# Patient Record
Sex: Female | Born: 1970 | Race: White | Hispanic: No | Marital: Single | Smoking: Current every day smoker
Health system: Southern US, Community
[De-identification: ages and names within clinical notes are randomized; demographics above are authoritative.]

## PROBLEM LIST (undated history)

## (undated) DIAGNOSIS — G4700 Insomnia, unspecified: Secondary | ICD-10-CM

## (undated) DIAGNOSIS — K589 Irritable bowel syndrome without diarrhea: Secondary | ICD-10-CM

## (undated) DIAGNOSIS — N200 Calculus of kidney: Secondary | ICD-10-CM

## (undated) DIAGNOSIS — K219 Gastro-esophageal reflux disease without esophagitis: Secondary | ICD-10-CM

## (undated) DIAGNOSIS — D699 Hemorrhagic condition, unspecified: Secondary | ICD-10-CM

## (undated) DIAGNOSIS — D649 Anemia, unspecified: Secondary | ICD-10-CM

## (undated) DIAGNOSIS — IMO0002 Reserved for concepts with insufficient information to code with codable children: Secondary | ICD-10-CM

## (undated) DIAGNOSIS — E119 Type 2 diabetes mellitus without complications: Secondary | ICD-10-CM

## (undated) HISTORY — DX: Reserved for concepts with insufficient information to code with codable children: IMO0002

## (undated) HISTORY — DX: Gastro-esophageal reflux disease without esophagitis: K21.9

## (undated) HISTORY — PX: SHOULDER SURGERY: SHX246

## (undated) HISTORY — DX: Hemorrhagic condition, unspecified: D69.9

## (undated) HISTORY — DX: Anemia, unspecified: D64.9

## (undated) HISTORY — DX: Type 2 diabetes mellitus without complications: E11.9

## (undated) HISTORY — DX: Calculus of kidney: N20.0

## (undated) HISTORY — DX: Irritable bowel syndrome, unspecified: K58.9

## (undated) HISTORY — DX: Insomnia, unspecified: G47.00

---

## 1992-02-27 HISTORY — PX: CHOLECYSTECTOMY: SHX55

## 1998-02-26 HISTORY — PX: ABDOMINOPLASTY: SUR9

## 2004-03-01 ENCOUNTER — Emergency Department: Payer: Self-pay | Admitting: Emergency Medicine

## 2004-04-27 ENCOUNTER — Emergency Department: Payer: Self-pay | Admitting: Emergency Medicine

## 2004-05-10 ENCOUNTER — Ambulatory Visit: Payer: Self-pay | Admitting: Internal Medicine

## 2004-05-27 ENCOUNTER — Ambulatory Visit: Payer: Self-pay | Admitting: Internal Medicine

## 2004-06-26 ENCOUNTER — Ambulatory Visit: Payer: Self-pay | Admitting: Internal Medicine

## 2005-08-14 ENCOUNTER — Emergency Department: Payer: Self-pay | Admitting: Emergency Medicine

## 2005-12-13 ENCOUNTER — Emergency Department: Payer: Self-pay | Admitting: Unknown Physician Specialty

## 2007-02-20 ENCOUNTER — Emergency Department: Payer: Self-pay | Admitting: Internal Medicine

## 2007-02-20 ENCOUNTER — Other Ambulatory Visit: Payer: Self-pay

## 2007-06-08 ENCOUNTER — Emergency Department: Payer: Self-pay | Admitting: Emergency Medicine

## 2007-09-18 ENCOUNTER — Emergency Department: Payer: Self-pay | Admitting: Emergency Medicine

## 2009-01-24 ENCOUNTER — Emergency Department: Payer: Self-pay | Admitting: Emergency Medicine

## 2010-10-15 ENCOUNTER — Emergency Department: Payer: Self-pay | Admitting: *Deleted

## 2010-11-30 ENCOUNTER — Ambulatory Visit: Payer: Self-pay | Admitting: Internal Medicine

## 2010-12-05 ENCOUNTER — Ambulatory Visit: Payer: Self-pay | Admitting: Internal Medicine

## 2010-12-28 ENCOUNTER — Ambulatory Visit: Payer: Self-pay | Admitting: Internal Medicine

## 2011-03-01 ENCOUNTER — Emergency Department: Payer: Self-pay | Admitting: Emergency Medicine

## 2011-03-02 LAB — CBC
MCH: 28.5 pg (ref 26.0–34.0)
MCHC: 33.2 g/dL (ref 32.0–36.0)
Platelet: 399 10*3/uL (ref 150–440)
RBC: 4.42 10*6/uL (ref 3.80–5.20)

## 2011-03-02 LAB — COMPREHENSIVE METABOLIC PANEL
Albumin: 3.6 g/dL (ref 3.4–5.0)
Alkaline Phosphatase: 65 U/L (ref 50–136)
Anion Gap: 13 (ref 7–16)
BUN: 15 mg/dL (ref 7–18)
Bilirubin,Total: 0.1 mg/dL — ABNORMAL LOW (ref 0.2–1.0)
Calcium, Total: 8.6 mg/dL (ref 8.5–10.1)
Co2: 26 mmol/L (ref 21–32)
Creatinine: 0.91 mg/dL (ref 0.60–1.30)
EGFR (African American): >60
EGFR (Non-African Amer.): >60
Glucose: 249 mg/dL — ABNORMAL HIGH (ref 65–99)
Osmolality: 290 (ref 275–301)

## 2011-03-02 LAB — URINALYSIS, COMPLETE
Ketone: NEGATIVE
Nitrite: NEGATIVE
Protein: NEGATIVE
Specific Gravity: 1.025 (ref 1.003–1.030)
Squamous Epithelial: 2
WBC UR: 2 /HPF (ref 0–5)

## 2011-03-02 LAB — LIPASE, BLOOD: Lipase: 288 U/L (ref 73–393)

## 2011-06-11 ENCOUNTER — Emergency Department: Payer: Self-pay | Admitting: Emergency Medicine

## 2011-06-11 LAB — URINALYSIS, COMPLETE
Bilirubin,UR: NEGATIVE
Glucose,UR: NEGATIVE mg/dL (ref 0–75)
Ketone: NEGATIVE
Leukocyte Esterase: NEGATIVE
Ph: 5 (ref 4.5–8.0)
Protein: NEGATIVE
Specific Gravity: 1.021 (ref 1.003–1.030)

## 2011-06-11 LAB — COMPREHENSIVE METABOLIC PANEL
Albumin: 4 g/dL (ref 3.4–5.0)
Alkaline Phosphatase: 86 U/L (ref 50–136)
Anion Gap: 8 (ref 7–16)
BUN: 10 mg/dL (ref 7–18)
Bilirubin,Total: 0.3 mg/dL (ref 0.2–1.0)
Calcium, Total: 8.9 mg/dL (ref 8.5–10.1)
Co2: 27 mmol/L (ref 21–32)
Creatinine: 0.91 mg/dL (ref 0.60–1.30)
EGFR (African American): >60
EGFR (Non-African Amer.): >60
Glucose: 135 mg/dL — ABNORMAL HIGH (ref 65–99)
Sodium: 140 mmol/L (ref 136–145)
Total Protein: 8.2 g/dL (ref 6.4–8.2)

## 2011-06-11 LAB — CBC
MCH: 27.9 pg (ref 26.0–34.0)
MCHC: 32.1 g/dL (ref 32.0–36.0)
WBC: 10.5 10*3/uL (ref 3.6–11.0)

## 2011-06-14 ENCOUNTER — Emergency Department: Payer: Self-pay | Admitting: Emergency Medicine

## 2011-06-14 ENCOUNTER — Ambulatory Visit: Payer: Self-pay | Admitting: Urology

## 2011-11-29 ENCOUNTER — Ambulatory Visit: Payer: Self-pay | Admitting: Internal Medicine

## 2011-11-29 LAB — CBC CANCER CENTER
Basophil #: 0.1 x10 3/mm (ref 0.0–0.1)
Eosinophil #: 0.4 x10 3/mm (ref 0.0–0.7)
HCT: 35.6 % (ref 35.0–47.0)
Lymphocyte #: 3 x10 3/mm (ref 1.0–3.6)
MCHC: 32.5 g/dL (ref 32.0–36.0)
MCV: 80 fL (ref 80–100)
Neutrophil #: 5.3 x10 3/mm (ref 1.4–6.5)
Platelet: 427 x10 3/mm (ref 150–440)
RBC: 4.43 10*6/uL (ref 3.80–5.20)
RDW: 15.6 % — ABNORMAL HIGH (ref 11.5–14.5)

## 2011-11-29 LAB — FERRITIN: Ferritin (ARMC): 28 ng/mL (ref 8–388)

## 2011-11-29 LAB — IRON AND TIBC
Iron Bind.Cap.(Total): 373 ug/dL (ref 250–450)
Iron Saturation: 6 %
Iron: 22 ug/dL — ABNORMAL LOW (ref 50–170)
Unbound Iron-Bind.Cap.: 351 ug/dL

## 2011-12-28 ENCOUNTER — Ambulatory Visit: Payer: Self-pay | Admitting: Internal Medicine

## 2012-02-27 DIAGNOSIS — E119 Type 2 diabetes mellitus without complications: Secondary | ICD-10-CM

## 2012-02-27 HISTORY — PX: LEFT OOPHORECTOMY: SHX1961

## 2012-02-27 HISTORY — DX: Type 2 diabetes mellitus without complications: E11.9

## 2012-02-27 HISTORY — PX: ABDOMINAL HYSTERECTOMY: SHX81

## 2012-03-04 ENCOUNTER — Ambulatory Visit: Payer: Self-pay | Admitting: Internal Medicine

## 2012-03-04 LAB — CBC CANCER CENTER
Basophil #: 0.1 x10 3/mm (ref 0.0–0.1)
Basophil %: 0.8 %
Eosinophil #: 0.3 x10 3/mm (ref 0.0–0.7)
HCT: 42.2 % (ref 35.0–47.0)
Lymphocyte #: 2.4 x10 3/mm (ref 1.0–3.6)
Lymphocyte %: 23.1 %
MCHC: 34 g/dL (ref 32.0–36.0)
MCV: 89 fL (ref 80–100)
Monocyte #: 0.7 x10 3/mm (ref 0.2–0.9)
Neutrophil #: 7 x10 3/mm — ABNORMAL HIGH (ref 1.4–6.5)
Neutrophil %: 66.8 %

## 2012-03-04 LAB — FERRITIN: Ferritin (ARMC): 153 ng/mL (ref 8–388)

## 2012-03-04 LAB — IRON AND TIBC
Iron Bind.Cap.(Total): 320 ug/dL (ref 250–450)
Iron Saturation: 13 %
Unbound Iron-Bind.Cap.: 278 ug/dL

## 2012-03-29 ENCOUNTER — Ambulatory Visit: Payer: Self-pay | Admitting: Internal Medicine

## 2012-04-28 ENCOUNTER — Ambulatory Visit: Payer: Self-pay | Admitting: Internal Medicine

## 2012-05-27 ENCOUNTER — Ambulatory Visit: Payer: Self-pay | Admitting: Internal Medicine

## 2012-06-10 LAB — IRON AND TIBC: Iron Saturation: 13 %

## 2012-06-10 LAB — FERRITIN: Ferritin (ARMC): 128 ng/mL (ref 8–388)

## 2012-06-11 ENCOUNTER — Ambulatory Visit: Payer: Self-pay | Admitting: Obstetrics and Gynecology

## 2012-06-11 LAB — BASIC METABOLIC PANEL
BUN: 8 mg/dL (ref 7–18)
Calcium, Total: 8.5 mg/dL (ref 8.5–10.1)
Chloride: 101 mmol/L (ref 98–107)
Co2: 26 mmol/L (ref 21–32)
Creatinine: 0.77 mg/dL (ref 0.60–1.30)
EGFR (Non-African Amer.): >60
Osmolality: 275 (ref 275–301)
Potassium: 3.7 mmol/L (ref 3.5–5.1)
Sodium: 134 mmol/L — ABNORMAL LOW (ref 136–145)

## 2012-06-11 LAB — PREGNANCY, URINE: Pregnancy Test, Urine: NEGATIVE m[IU]/mL

## 2012-06-11 LAB — CBC
MCH: 29.8 pg (ref 26.0–34.0)
MCHC: 33.8 g/dL (ref 32.0–36.0)
Platelet: 462 10*3/uL — ABNORMAL HIGH (ref 150–440)
RDW: 13.9 % (ref 11.5–14.5)

## 2012-06-16 ENCOUNTER — Ambulatory Visit: Payer: Self-pay | Admitting: Obstetrics and Gynecology

## 2012-06-17 LAB — HEMOGLOBIN: HGB: 13.1 g/dL (ref 12.0–16.0)

## 2012-06-18 LAB — PATHOLOGY REPORT

## 2012-06-26 ENCOUNTER — Ambulatory Visit: Payer: Self-pay | Admitting: Internal Medicine

## 2012-08-21 ENCOUNTER — Ambulatory Visit: Payer: Self-pay | Admitting: Internal Medicine

## 2012-08-26 ENCOUNTER — Ambulatory Visit: Payer: Self-pay | Admitting: Internal Medicine

## 2012-09-15 ENCOUNTER — Emergency Department: Payer: Self-pay | Admitting: Emergency Medicine

## 2012-10-09 ENCOUNTER — Ambulatory Visit: Payer: Self-pay | Admitting: Internal Medicine

## 2012-10-14 LAB — CBC CANCER CENTER
Basophil #: 0.1 x10 3/mm (ref 0.0–0.1)
MCV: 87 fL (ref 80–100)
Monocyte #: 0.6 x10 3/mm (ref 0.2–0.9)
Neutrophil #: 4.7 x10 3/mm (ref 1.4–6.5)
Neutrophil %: 59.3 %
RBC: 4.62 10*6/uL (ref 3.80–5.20)
RDW: 13.4 % (ref 11.5–14.5)
WBC: 7.9 x10 3/mm (ref 3.6–11.0)

## 2012-10-14 LAB — IRON AND TIBC
Iron Bind.Cap.(Total): 319 ug/dL (ref 250–450)
Iron Saturation: 14 %

## 2012-10-27 ENCOUNTER — Ambulatory Visit: Payer: Self-pay | Admitting: Internal Medicine

## 2012-11-26 ENCOUNTER — Ambulatory Visit: Payer: Self-pay | Admitting: Internal Medicine

## 2012-12-03 ENCOUNTER — Encounter: Payer: Self-pay | Admitting: *Deleted

## 2012-12-24 ENCOUNTER — Ambulatory Visit (INDEPENDENT_AMBULATORY_CARE_PROVIDER_SITE_OTHER): Payer: BC Managed Care – PPO | Admitting: General Surgery

## 2012-12-24 ENCOUNTER — Encounter: Payer: Self-pay | Admitting: General Surgery

## 2012-12-24 VITALS — BP 128/60 | HR 72 | Resp 12 | Ht 67.5 in | Wt 157.0 lb

## 2012-12-24 DIAGNOSIS — L723 Sebaceous cyst: Secondary | ICD-10-CM

## 2012-12-24 DIAGNOSIS — L729 Follicular cyst of the skin and subcutaneous tissue, unspecified: Secondary | ICD-10-CM

## 2012-12-24 NOTE — Patient Instructions (Signed)
Patient to return for excision of right gluteal cyst.

## 2012-12-24 NOTE — Progress Notes (Signed)
Patient ID: Carrie Graham, female   DOB: 12-01-1970, 42 y.o.   MRN: [id number]  Chief Complaint  Patient presents with  . Other    right gluteous cyst    HPI Carrie Graham is a 42 y.o. female who presents for an evaluation of a right gluteal cyst. The patient states the area has been there approximately 1 year. She states it flares from time to time. No current problems with this area. She does have drainage from time to time.   HPI  Past Medical History  Diagnosis Date  . Anemia   . Insomnia   . IBS (irritable bowel syndrome)   . GERD (gastroesophageal reflux disease)   . Ulcer   . Kidney stones   . Bleeding disorder     Past Surgical History  Procedure Laterality Date  . Abdominal hysterectomy  2014  . Cholecystectomy  1994  . Cesarean section  [id number]  . Abdominoplasty  2000  . Left oophorectomy  2014    Family History  Problem Relation Age of Onset  . Cancer Mother     ovarian    Social History History  Substance Use Topics  . Smoking status: Never Smoker   . Smokeless tobacco: Not on file  . Alcohol Use: No    Allergies  Allergen Reactions  . Nsaids Other (See Comments)    bleeding  . Sulfa Antibiotics Other (See Comments)    Blisters in throat  . Toradol [Ketorolac Tromethamine] Nausea And Vomiting    Current Outpatient Prescriptions  Medication Sig Dispense Refill  . temazepam (RESTORIL) 15 MG capsule Take 15 mg by mouth at bedtime as needed for sleep.       No current facility-administered medications for this visit.    Review of Systems Review of Systems  Constitutional: Negative.   Respiratory: Negative.   Cardiovascular: Negative.     Blood pressure 128/60, pulse 72, resp. rate 12, height 5' 7.5" (1.715 m), weight 157 lb (71.215 kg).  Physical Exam Physical Exam  Constitutional: She is oriented to person, place, and time. She appears well-developed and well-nourished.  Abdominal: Soft. Normal appearance and bowel sounds are  normal. There is no tenderness.  Genitourinary:  1 cm or less skin thickening located in the right ischial area. No communication towards the rectum noted.   Lymphadenopathy:       Right: No inguinal adenopathy present.       Left: No inguinal adenopathy present.  Neurological: She is alert and oriented to person, place, and time.  Skin: Skin is warm and dry.    Data Reviewed None.   Assessment    Skin cyst right gluteal area. Recommended excision and she is agreeable.    Plan    Patient to return for excision of right gluteal cyst.        Keishawn Rajewski G 12/25/2012, 6:08 AM

## 2012-12-25 ENCOUNTER — Encounter: Payer: Self-pay | Admitting: General Surgery

## 2013-01-01 ENCOUNTER — Encounter: Payer: Self-pay | Admitting: General Surgery

## 2013-01-01 ENCOUNTER — Ambulatory Visit (INDEPENDENT_AMBULATORY_CARE_PROVIDER_SITE_OTHER): Payer: BC Managed Care – PPO | Admitting: General Surgery

## 2013-01-01 VITALS — BP 124/76 | HR 78 | Resp 12 | Ht 67.5 in | Wt 158.0 lb

## 2013-01-01 DIAGNOSIS — L723 Sebaceous cyst: Secondary | ICD-10-CM

## 2013-01-01 NOTE — Progress Notes (Signed)
Patient here today for excision of a right gluteal mass.  Procedure: Excision skin cyst right gluteal area. Anesthetic: 10 mL of 0.5% Marcaine mixed with 1% Xylocaine.  Prep: ChloraPrep  After the gluteal area was prepped and draped out an elliptical excision was then performed of assistant was a little less than a 2 cm size. Beeding was controlled with a disposable cautery. The wound was then closed with interrupted figure-of-eight stitches of 4-0 nylon. Excised tissue sent for pathology. Neosporin ointment and 4x4 placed over the wound. No immediate problems from the procedure encountered. Wound care instructions were given and patient is to return in about 10 days for suture removal.

## 2013-01-01 NOTE — Patient Instructions (Addendum)
The patient is aware to call back for any questions or concerns. Nurse 10 days for suture removal Ice pack off/on for the rest of the day for comfort

## 2013-01-02 LAB — PATHOLOGY

## 2013-01-06 ENCOUNTER — Telehealth: Payer: Self-pay | Admitting: *Deleted

## 2013-01-06 NOTE — Telephone Encounter (Signed)
Message copied by Currie Paris on Tue Jan 06, 2013  8:14 AM ------      Message from: Kieth Brightly      Created: Mon Jan 05, 2013  8:23 AM       Please let pt pt know the pathology was normal. ------

## 2013-01-06 NOTE — Telephone Encounter (Signed)
Notified patient as instructed, patient pleased. Discussed follow-up appointments, patient agrees  

## 2013-01-07 IMAGING — CR DG ABDOMEN 1V
1 series · 2 of 2 positions shown · non-contrast
Comparison: none

REASON FOR EXAM: Calculus
COMMENTS:

[Series 1: t abdomen supine · 0.14mm/px · 2 of 2 slices shown]
[im 1/2]
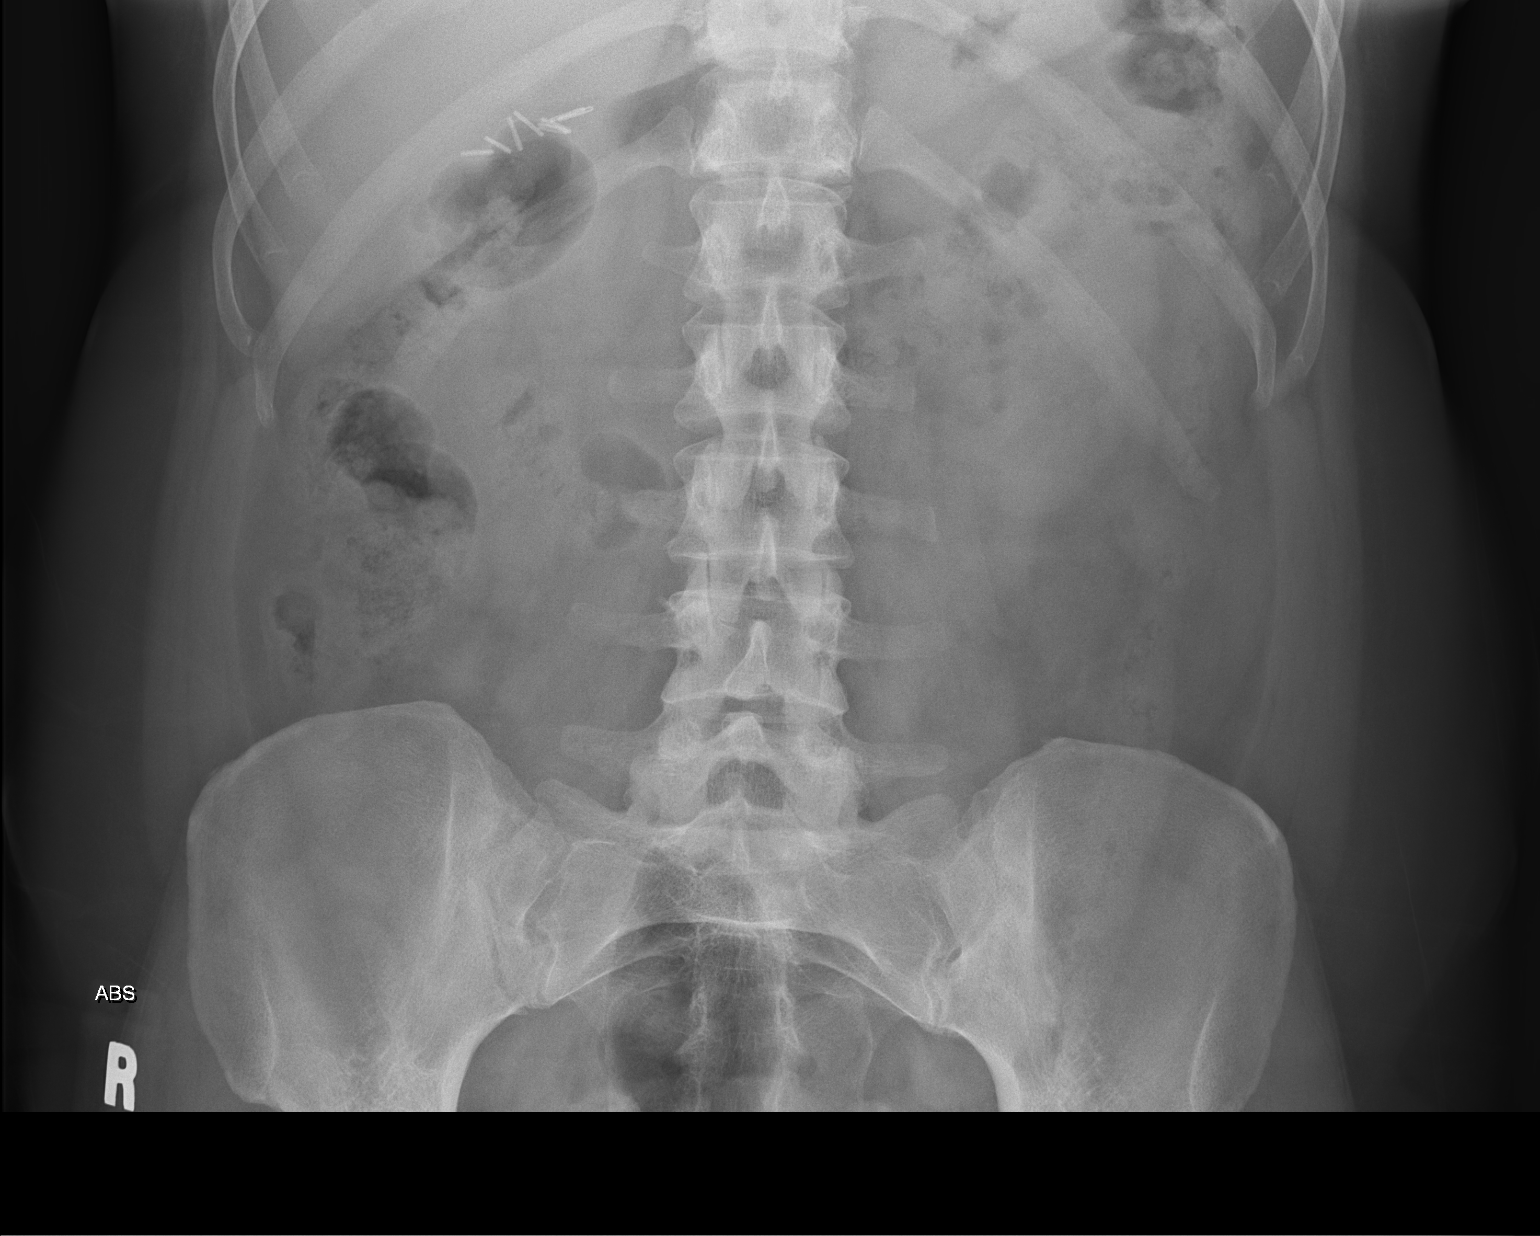
[im 2/2]
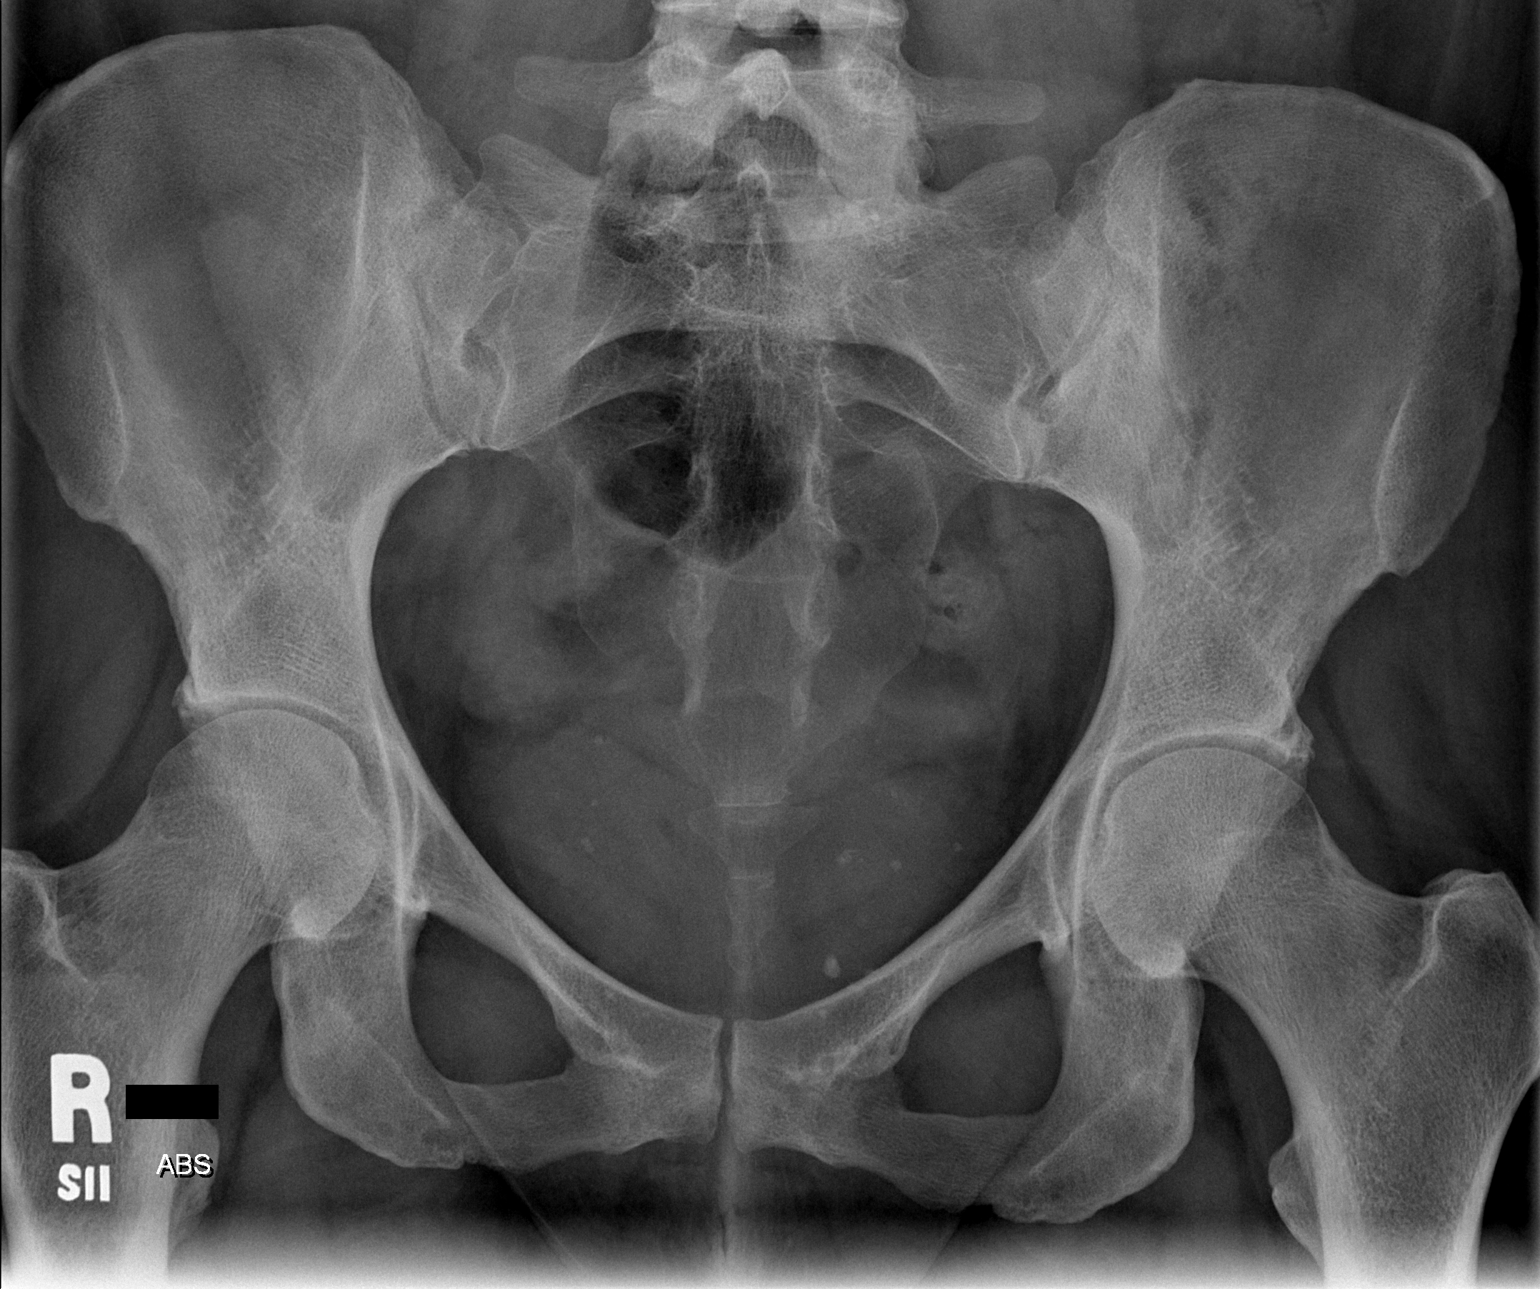

[2 of 2 positions shown; findings below may reference images not displayed]

PROCEDURE:     DXR - DXR KIDNEY URETER BLADDER  - June 14, 2011  [DATE]

RESULT:

No definite intrarenal calcifications are seen. There are multiple
calcifications in the pelvic area bilaterally compatible with phleboliths.
There is an irregular calcification located low in the medial aspect of the
left pelvis that could represent the distal left ureteral stone observed at
CT.

The bowel gas pattern is normal. Post operative metallic clips are noted in
the region of the gallbladder bed.
IMPRESSION: 1.  No definite intrarenal stones are seen.
2.  Possible distal left ureterolithiasis.

## 2013-01-13 ENCOUNTER — Ambulatory Visit: Payer: BC Managed Care – PPO | Admitting: *Deleted

## 2013-01-13 ENCOUNTER — Ambulatory Visit: Payer: Self-pay | Admitting: Internal Medicine

## 2013-01-13 DIAGNOSIS — L729 Follicular cyst of the skin and subcutaneous tissue, unspecified: Secondary | ICD-10-CM

## 2013-01-13 LAB — CBC CANCER CENTER
Basophil %: 1.1 %
Eosinophil #: 0.3 x10 3/mm (ref 0.0–0.7)
Lymphocyte #: 1.9 x10 3/mm (ref 1.0–3.6)
Lymphocyte %: 21.1 %
MCH: 31 pg (ref 26.0–34.0)
MCHC: 34.5 g/dL (ref 32.0–36.0)
MCV: 90 fL (ref 80–100)
Neutrophil #: 6.2 x10 3/mm (ref 1.4–6.5)
Neutrophil %: 68 %
Platelet: 362 x10 3/mm (ref 150–440)
RBC: 4.64 10*6/uL (ref 3.80–5.20)

## 2013-01-13 LAB — IRON AND TIBC: Iron: 73 ug/dL (ref 50–170)

## 2013-01-13 NOTE — Progress Notes (Signed)
Here today for suture removal. Area clean and well healed.  No signs of infection.

## 2013-01-13 NOTE — Patient Instructions (Signed)
The patient is aware to call back for any questions or concerns.  

## 2013-01-21 ENCOUNTER — Ambulatory Visit: Payer: BC Managed Care – PPO | Admitting: General Surgery

## 2013-01-26 ENCOUNTER — Ambulatory Visit: Payer: Self-pay | Admitting: Internal Medicine

## 2013-02-04 ENCOUNTER — Ambulatory Visit (INDEPENDENT_AMBULATORY_CARE_PROVIDER_SITE_OTHER): Payer: BC Managed Care – PPO | Admitting: General Surgery

## 2013-02-04 ENCOUNTER — Encounter: Payer: Self-pay | Admitting: General Surgery

## 2013-02-04 VITALS — BP 110/68 | HR 74 | Resp 12 | Ht 67.0 in | Wt 163.0 lb

## 2013-02-04 DIAGNOSIS — K429 Umbilical hernia without obstruction or gangrene: Secondary | ICD-10-CM | POA: Insufficient documentation

## 2013-02-04 NOTE — Patient Instructions (Addendum)
Hernia A hernia occurs when an internal organ pushes out through a weak spot in the abdominal wall. Hernias most commonly occur in the groin and around the navel. Hernias often can be pushed back into place (reduced). Most hernias tend to get worse over time. Some abdominal hernias can get stuck in the opening (irreducible or incarcerated hernia) and cannot be reduced. An irreducible abdominal hernia which is tightly squeezed into the opening is at risk for impaired blood supply (strangulated hernia). A strangulated hernia is a medical emergency. Because of the risk for an irreducible or strangulated hernia, surgery may be recommended to repair a hernia. CAUSES   Heavy lifting.  Prolonged coughing.  Straining to have a bowel movement.  A cut (incision) made during an abdominal surgery. HOME CARE INSTRUCTIONS   Bed rest is not required. You may continue your normal activities.  Avoid lifting more than 10 pounds (4.5 kg) or straining.  Cough gently. If you are a smoker it is best to stop. Even the best hernia repair can break down with the continual strain of coughing. Even if you do not have your hernia repaired, a cough will continue to aggravate the problem.  Do not wear anything tight over your hernia. Do not try to keep it in with an outside bandage or truss. These can damage abdominal contents if they are trapped within the hernia sac.  Eat a normal diet.  Avoid constipation. Straining over long periods of time will increase hernia size and encourage breakdown of repairs. If you cannot do this with diet alone, stool softeners may be used. SEEK IMMEDIATE MEDICAL CARE IF:   You have a fever.  You develop increasing abdominal pain.  You feel nauseous or vomit.  Your hernia is stuck outside the abdomen, looks discolored, feels hard, or is tender.  You have any changes in your bowel habits or in the hernia that are unusual for you.  You have increased pain or swelling around the  hernia.  You cannot push the hernia back in place by applying gentle pressure while lying down. MAKE SURE YOU:   Understand these instructions.  Will watch your condition.  Will get help right away if you are not doing well or get worse. Document Released: 02/12/2005 Document Revised: 05/07/2011 Document Reviewed: 10/02/2007 [facility] Patient Information 2014 [place], [place].  Patient's surgery has been scheduled for 03-03-13 at [facility].

## 2013-02-04 NOTE — Progress Notes (Signed)
Patient ID: Carrie Graham, female   DOB: 26-May-1970, 42 y.o.   MRN: [id number]  Chief Complaint  Patient presents with  . Hernia    Carrie Graham is a 42 y.o. female.  Here today for evaluation of possible umbilical hernia.  States it has been there about 6 months.  The abdominal pain has been since last year located left upper abdomen and central abdomen. She notices tenderness at the umbilical area for the past couple of months. Newly diagnosed diabetic. HPI  Past Medical History  Diagnosis Date  . Anemia   . Insomnia   . IBS (irritable bowel syndrome)   . GERD (gastroesophageal reflux disease)   . Ulcer   . Kidney stones   . Bleeding disorder   . Diabetes mellitus without complication 2014    Past Surgical History  Procedure Laterality Date  . Abdominal hysterectomy  2014  . Cholecystectomy  1994  . Cesarean section  [id number]  . Abdominoplasty  2000  . Left oophorectomy  2014    Family History  Problem Relation Age of Onset  . Cancer Mother     ovarian    Social History History  Substance Use Topics  . Smoking status: Never Smoker   . Smokeless tobacco: Not on file  . Alcohol Use: No    Allergies  Allergen Reactions  . Nsaids Other (See Comments)    bleeding  . Sulfa Antibiotics Other (See Comments)    Blisters in throat  . Toradol [Ketorolac Tromethamine] Nausea And Vomiting    Current Outpatient Prescriptions  Medication Sig Dispense Refill  . ALPRAZolam (XANAX) 0.5 MG tablet Take 0.5 mg by mouth as needed for anxiety.      Marland Kitchen escitalopram (LEXAPRO) 10 MG tablet Take 10 mg by mouth daily.      . Insulin Aspart (NOVOLOG [place]) Inject into the skin 3 (three) times daily.      . Insulin Detemir (LEVEMIR [place]) Inject into the skin 2 (two) times daily.      . sitaGLIPtin (JANUVIA) 100 MG tablet Take 100 mg by mouth daily.      . temazepam (RESTORIL) 15 MG capsule Take 15 mg by mouth at bedtime as needed for sleep.       No current facility-administered  medications for this visit.    Review of Systems Review of Systems  Constitutional: Negative.   Respiratory: Negative.   Cardiovascular: Negative.   Gastrointestinal: Positive for abdominal pain.    Blood pressure 110/68, pulse 74, resp. rate 12, height 5\' 7"  (1.702 m), weight 163 lb (73.936 kg).  Physical Exam Physical Exam  Constitutional: She is oriented to person, place, and time. She appears well-developed and well-nourished.  Eyes: Conjunctivae are normal. No scleral icterus.  Neck: Neck supple.  Cardiovascular: Normal rate, regular rhythm and normal heart sounds.   Pulmonary/Chest: Effort normal and breath sounds normal.  Abdominal: Soft. Normal appearance and bowel sounds are normal. There is no tenderness. A hernia is present.  2 cm defect at the umbilicus, minimally tender  Lymphadenopathy:    She has no cervical adenopathy.  Neurological: She is alert and oriented to person, place, and time.  Skin: Skin is warm and dry.    Data Reviewed none  Assessment    A 2 cm defect at the umbilicus, minimally tender.    Plan    Hernia precautions and incarceration were discussed with the patient. If they develop symptoms of an incarcerated hernia, they were encouraged to seek prompt  medical attention.  I have recommended repair of the hernia using mesh on an outpatient basis in the near future. The risk of infection was reviewed. The role of prosthetic mesh to minimize the risk of recurrence was reviewed.    Patient's surgery has been scheduled for 03-03-13 at [facility]. This patient will not require a pre-operative visit. History and physical will be updated the morning of procedure.   Carrie Graham 02/04/2013, 12:21 PM

## 2013-02-18 ENCOUNTER — Other Ambulatory Visit: Payer: Self-pay | Admitting: General Surgery

## 2013-02-18 DIAGNOSIS — K429 Umbilical hernia without obstruction or gangrene: Secondary | ICD-10-CM

## 2013-02-24 ENCOUNTER — Other Ambulatory Visit: Payer: Self-pay | Admitting: Anesthesiology

## 2013-02-24 LAB — IRON AND TIBC
Iron Bind.Cap.(Total): 308 ug/dL (ref 250–450)
Iron Saturation: 24 %

## 2013-02-24 LAB — CBC CANCER CENTER
Basophil #: 0.1 x10 3/mm (ref 0.0–0.1)
Eosinophil #: 0.4 x10 3/mm (ref 0.0–0.7)
Eosinophil %: 3.8 %
HGB: 14 g/dL (ref 12.0–16.0)
Lymphocyte #: 1.9 x10 3/mm (ref 1.0–3.6)
Lymphocyte %: 18.5 %
MCH: 30.4 pg (ref 26.0–34.0)
MCV: 92 fL (ref 80–100)
Monocyte #: 0.6 x10 3/mm (ref 0.2–0.9)
Monocyte %: 5.5 %
Platelet: 401 x10 3/mm (ref 150–440)
RDW: 12.9 % (ref 11.5–14.5)
WBC: 10.3 x10 3/mm (ref 3.6–11.0)

## 2013-02-24 LAB — BASIC METABOLIC PANEL
BUN: 10 mg/dL (ref 7–18)
Calcium, Total: 9 mg/dL (ref 8.5–10.1)
Creatinine: 0.85 mg/dL (ref 0.60–1.30)
EGFR (Non-African Amer.): >60
Glucose: 357 mg/dL — ABNORMAL HIGH (ref 65–99)
Osmolality: 287 (ref 275–301)
Sodium: 137 mmol/L (ref 136–145)

## 2013-02-24 LAB — FERRITIN: Ferritin (ARMC): 445 ng/mL — ABNORMAL HIGH (ref 8–388)

## 2013-02-25 ENCOUNTER — Other Ambulatory Visit: Payer: Self-pay | Admitting: Anesthesiology

## 2013-02-25 DIAGNOSIS — I10 Essential (primary) hypertension: Secondary | ICD-10-CM

## 2013-02-26 ENCOUNTER — Ambulatory Visit: Payer: Self-pay | Admitting: Internal Medicine

## 2013-03-03 ENCOUNTER — Ambulatory Visit: Payer: Self-pay | Admitting: General Surgery

## 2013-03-03 DIAGNOSIS — K429 Umbilical hernia without obstruction or gangrene: Secondary | ICD-10-CM

## 2013-03-03 HISTORY — PX: HERNIA REPAIR: SHX51

## 2013-03-04 ENCOUNTER — Encounter: Payer: Self-pay | Admitting: General Surgery

## 2013-03-11 ENCOUNTER — Ambulatory Visit (INDEPENDENT_AMBULATORY_CARE_PROVIDER_SITE_OTHER): Payer: BC Managed Care – PPO | Admitting: General Surgery

## 2013-03-11 ENCOUNTER — Encounter: Payer: Self-pay | Admitting: General Surgery

## 2013-03-11 VITALS — BP 102/82 | HR 78 | Resp 12 | Ht 67.0 in | Wt 160.0 lb

## 2013-03-11 DIAGNOSIS — K429 Umbilical hernia without obstruction or gangrene: Secondary | ICD-10-CM

## 2013-03-11 NOTE — Patient Instructions (Addendum)
The patient is aware to call back for any questions or concerns. May increase activity as tolerated.

## 2013-03-11 NOTE — Progress Notes (Signed)
Patient here today for post operative visit she had lap/umbilical hernia repair with Bard mesh 03-03-13, pain is improving.  Blood sugars are improving. Ports site healing, derma bond in place and repair is intact. Dimpling area should improve over time.

## 2013-03-22 ENCOUNTER — Observation Stay: Payer: Self-pay | Admitting: General Surgery

## 2013-03-22 LAB — BASIC METABOLIC PANEL
ANION GAP: 3 — AB (ref 7–16)
BUN: 11 mg/dL (ref 7–18)
CALCIUM: 9.3 mg/dL (ref 8.5–10.1)
CHLORIDE: 102 mmol/L (ref 98–107)
CO2: 29 mmol/L (ref 21–32)
Creatinine: 0.64 mg/dL (ref 0.60–1.30)
EGFR (Non-African Amer.): >60
Glucose: 148 mg/dL — ABNORMAL HIGH (ref 65–99)
Osmolality: 270 (ref 275–301)
Potassium: 3.8 mmol/L (ref 3.5–5.1)
Sodium: 134 mmol/L — ABNORMAL LOW (ref 136–145)

## 2013-03-22 LAB — CBC WITH DIFFERENTIAL/PLATELET
BASOS PCT: 1.1 %
Basophil #: 0.1 10*3/uL (ref 0.0–0.1)
Eosinophil #: 0.4 10*3/uL (ref 0.0–0.7)
Eosinophil %: 2.7 %
HCT: 40.6 % (ref 35.0–47.0)
HGB: 13.6 g/dL (ref 12.0–16.0)
Lymphocyte #: 2.6 10*3/uL (ref 1.0–3.6)
Lymphocyte %: 19.6 %
MCH: 30.7 pg (ref 26.0–34.0)
MCHC: 33.5 g/dL (ref 32.0–36.0)
MCV: 92 fL (ref 80–100)
MONO ABS: 0.7 x10 3/mm (ref 0.2–0.9)
Monocyte %: 5.3 %
NEUTROS PCT: 71.3 %
Neutrophil #: 9.5 10*3/uL — ABNORMAL HIGH (ref 1.4–6.5)
Platelet: 414 10*3/uL (ref 150–440)
RBC: 4.43 10*6/uL (ref 3.80–5.20)
RDW: 13 % (ref 11.5–14.5)
WBC: 13.3 10*3/uL — AB (ref 3.6–11.0)

## 2013-03-24 LAB — CBC WITH DIFFERENTIAL/PLATELET
Basophil #: 0.1 10*3/uL (ref 0.0–0.1)
Basophil %: 1 %
EOS ABS: 0.6 10*3/uL (ref 0.0–0.7)
EOS PCT: 5.1 %
HCT: 40.9 % (ref 35.0–47.0)
HGB: 13.5 g/dL (ref 12.0–16.0)
LYMPHS ABS: 3.3 10*3/uL (ref 1.0–3.6)
LYMPHS PCT: 30.1 %
MCH: 30.2 pg (ref 26.0–34.0)
MCHC: 32.9 g/dL (ref 32.0–36.0)
MCV: 92 fL (ref 80–100)
Monocyte #: 0.8 x10 3/mm (ref 0.2–0.9)
Monocyte %: 7.3 %
NEUTROS ABS: 6.1 10*3/uL (ref 1.4–6.5)
Neutrophil %: 56.5 %
Platelet: 467 10*3/uL — ABNORMAL HIGH (ref 150–440)
RBC: 4.45 10*6/uL (ref 3.80–5.20)
RDW: 12.9 % (ref 11.5–14.5)
WBC: 10.8 10*3/uL (ref 3.6–11.0)

## 2013-04-22 ENCOUNTER — Ambulatory Visit (INDEPENDENT_AMBULATORY_CARE_PROVIDER_SITE_OTHER): Payer: BC Managed Care – PPO | Admitting: General Surgery

## 2013-04-22 ENCOUNTER — Encounter: Payer: Self-pay | Admitting: General Surgery

## 2013-04-22 VITALS — BP 118/76 | HR 78 | Resp 16 | Ht 67.0 in | Wt 169.0 lb

## 2013-04-22 DIAGNOSIS — K429 Umbilical hernia without obstruction or gangrene: Secondary | ICD-10-CM

## 2013-04-22 NOTE — Patient Instructions (Signed)
Patient to return as needed. 

## 2013-04-22 NOTE — Progress Notes (Signed)
Patient ID: Carrie Graham [place], female   DOB: 05/18/1970, 43 y.o.   MRN: [id number]  Chief Complaint  Patient presents with  . Routine Post Op    hernia    HPI Carrie Graham [place] is a 43 y.o. female.  Here today for her follow up from hernia repair done 03-03-13. She states she is doing well. [facility] for infection, that has cleared. HPI  Past Medical History  Diagnosis Date  . Anemia   . Insomnia   . IBS (irritable bowel syndrome)   . GERD (gastroesophageal reflux disease)   . Ulcer   . Kidney stones   . Bleeding disorder   . Diabetes mellitus without complication 2014    Past Surgical History  Procedure Laterality Date  . Abdominal hysterectomy  2014  . Cholecystectomy  1994  . Cesarean section  [id number]  . Abdominoplasty  2000  . Left oophorectomy  2014  . Hernia repair  03-03-13    umbilical hernia    Family History  Problem Relation Age of Onset  . Cancer Mother     ovarian    Social History History  Substance Use Topics  . Smoking status: Never Smoker   . Smokeless tobacco: Not on file  . Alcohol Use: No    Allergies  Allergen Reactions  . Nsaids Other (See Comments)    bleeding  . Sulfa Antibiotics Other (See Comments)    Blisters in throat  . Toradol [Ketorolac Tromethamine] Nausea And Vomiting    Current Outpatient Prescriptions  Medication Sig Dispense Refill  . ALPRAZolam (XANAX) 0.5 MG tablet Take 0.5 mg by mouth as needed for anxiety.      Marland Kitchen. escitalopram (LEXAPRO) 10 MG tablet Take 10 mg by mouth daily.      . Insulin Aspart (NOVOLOG [place]) Inject into the skin 3 (three) times daily.      . Insulin Detemir (LEVEMIR [place]) Inject into the skin 2 (two) times daily.      . sitaGLIPtin (JANUVIA) 100 MG tablet Take 100 mg by mouth daily.      . temazepam (RESTORIL) 15 MG capsule Take 15 mg by mouth at bedtime as needed for sleep.       No current facility-administered medications for this visit.    Review of Systems Review of Systems   Constitutional: Negative.   Respiratory: Negative.   Cardiovascular: Negative.     Blood pressure 118/76, pulse 78, resp. rate 16, height 5\' 7"  (1.702 m), weight 169 lb (76.658 kg).  Physical Exam Physical Exam  Abdominal: Soft. There is no tenderness. No hernia.  Umbilical hernia repir site looks clean and healing well. No hernia noted All port sites are healed,.     Data Reviewed None   Assessment    Recovered well from lap umbilical hernia repair.    Plan    Patient to return as needed.       SANKAR,SEEPLAPUTHUR G 04/22/2013, 9:57 AM

## 2013-05-13 ENCOUNTER — Ambulatory Visit: Payer: Self-pay | Admitting: Internal Medicine

## 2013-05-13 LAB — IRON AND TIBC
Iron Bind.Cap.(Total): 292 ug/dL (ref 250–450)
Iron Saturation: 12 %
Iron: 34 ug/dL — ABNORMAL LOW (ref 50–170)
Unbound Iron-Bind.Cap.: 258 ug/dL

## 2013-05-13 LAB — FERRITIN: Ferritin (ARMC): 369 ng/mL (ref 8–388)

## 2013-05-13 LAB — CANCER CENTER HEMOGLOBIN: HGB: 14 g/dL (ref 12.0–16.0)

## 2013-05-27 ENCOUNTER — Ambulatory Visit: Payer: Self-pay | Admitting: Internal Medicine

## 2013-06-02 ENCOUNTER — Emergency Department: Payer: Self-pay | Admitting: Emergency Medicine

## 2013-06-03 LAB — URINALYSIS, COMPLETE
BACTERIA: NONE SEEN
BILIRUBIN, UR: NEGATIVE
Blood: NEGATIVE
Glucose,UR: >=500 mg/dL (ref 0–75)
KETONE: NEGATIVE
Leukocyte Esterase: NEGATIVE
NITRITE: NEGATIVE
Ph: 7 (ref 4.5–8.0)
Protein: NEGATIVE
RBC,UR: <1 /HPF (ref 0–5)
SPECIFIC GRAVITY: 1.021 (ref 1.003–1.030)
Squamous Epithelial: 2
WBC UR: <1 /HPF (ref 0–5)

## 2013-06-03 LAB — COMPREHENSIVE METABOLIC PANEL
ALBUMIN: 3.4 g/dL (ref 3.4–5.0)
ALK PHOS: 124 U/L — AB
ALT: 30 U/L (ref 12–78)
ANION GAP: 7 (ref 7–16)
AST: 13 U/L — AB (ref 15–37)
BUN: 11 mg/dL (ref 7–18)
Bilirubin,Total: 0.2 mg/dL (ref 0.2–1.0)
CALCIUM: 8.8 mg/dL (ref 8.5–10.1)
Chloride: 98 mmol/L (ref 98–107)
Co2: 25 mmol/L (ref 21–32)
Creatinine: 0.97 mg/dL (ref 0.60–1.30)
GLUCOSE: 377 mg/dL — AB (ref 65–99)
Osmolality: 276 (ref 275–301)
POTASSIUM: 3.4 mmol/L — AB (ref 3.5–5.1)
SODIUM: 130 mmol/L — AB (ref 136–145)
TOTAL PROTEIN: 7.9 g/dL (ref 6.4–8.2)

## 2013-06-03 LAB — CBC
HCT: 42 % (ref 35.0–47.0)
HGB: 14.2 g/dL (ref 12.0–16.0)
MCH: 30.3 pg (ref 26.0–34.0)
MCHC: 33.8 g/dL (ref 32.0–36.0)
MCV: 90 fL (ref 80–100)
PLATELETS: 359 10*3/uL (ref 150–440)
RBC: 4.67 10*6/uL (ref 3.80–5.20)
RDW: 14.2 % (ref 11.5–14.5)
WBC: 12.2 10*3/uL — AB (ref 3.6–11.0)

## 2013-07-24 ENCOUNTER — Ambulatory Visit: Payer: Self-pay | Admitting: Gastroenterology

## 2013-09-04 ENCOUNTER — Ambulatory Visit: Payer: Self-pay | Admitting: Oncology

## 2013-09-04 LAB — CBC CANCER CENTER
BASOS ABS: 0.2 x10 3/mm — AB (ref 0.0–0.1)
Basophil %: 1.2 %
EOS ABS: 0.5 x10 3/mm (ref 0.0–0.7)
Eosinophil %: 3.3 %
HCT: 44.7 % (ref 35.0–47.0)
HGB: 15 g/dL (ref 12.0–16.0)
Lymphocyte #: 2.3 x10 3/mm (ref 1.0–3.6)
Lymphocyte %: 14.9 %
MCH: 29.9 pg (ref 26.0–34.0)
MCHC: 33.6 g/dL (ref 32.0–36.0)
MCV: 89 fL (ref 80–100)
MONO ABS: 0.9 x10 3/mm (ref 0.2–0.9)
Monocyte %: 5.8 %
Neutrophil #: 11.6 x10 3/mm — ABNORMAL HIGH (ref 1.4–6.5)
Neutrophil %: 74.8 %
Platelet: 389 x10 3/mm (ref 150–440)
RBC: 5.02 10*6/uL (ref 3.80–5.20)
RDW: 13.4 % (ref 11.5–14.5)
WBC: 15.4 x10 3/mm — ABNORMAL HIGH (ref 3.6–11.0)

## 2013-09-04 LAB — IRON AND TIBC
IRON SATURATION: 20 %
Iron Bind.Cap.(Total): 320 ug/dL (ref 250–450)
Iron: 63 ug/dL (ref 50–170)
UNBOUND IRON-BIND. CAP.: 257 ug/dL

## 2013-09-04 LAB — FERRITIN: Ferritin (ARMC): 451 ng/mL — ABNORMAL HIGH (ref 8–388)

## 2013-09-26 ENCOUNTER — Ambulatory Visit: Payer: Self-pay | Admitting: Oncology

## 2013-12-11 ENCOUNTER — Ambulatory Visit: Payer: Self-pay | Admitting: Oncology

## 2013-12-28 ENCOUNTER — Encounter: Payer: Self-pay | Admitting: General Surgery

## 2014-06-18 NOTE — Op Note (Signed)
PATIENT NAME:  Carrie Graham, Carrie Graham MR#:  [id number] DATE OF BIRTH:  10/24/1970  DATE OF PROCEDURE:  06/16/2012  PREOPERATIVE DIAGNOSES:  1.  Chronic pelvic pain.  2.  Menorrhagia.   POSTOPERATIVE DIAGNOSES:  1.  Chronic pelvic pain.  2.  Menorrhagia.  3.  Endometriosis.  4.  Multicystic left ovary.   OPERATIVE PROCEDURE: LSH with LSO and right salpingectomy.   SURGEON: Herold Harms, M.D.   ASSISTANT: Earlie Lou, FNP and Ralene Muskrat, PA-S.   ANESTHESIA: General endotracheal.   INDICATIONS: The patient is a 44 year old white female, multiparous, who presents for definitive surgery for chronic menorrhagia and chronic pelvic pain refractory to medical therapy. The patient has known endometrial polyp within the uterine cavity on ultrasound preoperatively. The patient also had fibroids seen on ultrasound.   FINDINGS AT SURGERY: Enlarged uterus of approximately 10 weeks size. There was scarring at the bladder flap. There was endometriosis implants in the left ovarian fossa, which were cauterized. There was a multicystic left ovary of uncertain etiology and the decision was made to remove the left tube and ovary. Right ovary was normal.   DESCRIPTION OF PROCEDURE: The patient was brought to the operating room where she was placed in the supine position. General endotracheal anesthesia was induced without difficulty. She was placed in the dorsal lithotomy position using the bumblebee stirrups. A ChloraPrep and Betadine abdominal, perineal and intravaginal prep and drape was performed in the standard fashion. A Foley catheter was placed into the bladder and was draining clear yellow urine. A medium V-care manipulating instrument was placed into the endocervical canal and uterus in standard fashion. The patient was placed in the low lithotomy position. The hysterectomy was then performed in the standard fashion. A subumbilical vertical incision 5 mm in length was made. The Optiview  laparoscopic trocar system was used to place the 5 mm camera directly into the abdominal pelvic cavity without evidence of bowel or vascular injury. An 11 mm port was placed in the right lower quadrant and a 5 mm port was placed in the left lower quadrant under direct visualization. CO2 pneumoperitoneum had been created. The above-noted findings were photo documented. The fallopian tubes were initially excised using the Ace Harmonic scalpel and graspers. The right tube was removed.   A decision was made to remove the left tube and ovary because of the abnormal nature of the left ovarian appearance. The Ace Harmonic scalpel was used to clamp, coagulate and cut the round ligament. The infundibulopelvic ligament was likewise clamped, coagulated and cut. Once the left tube and ovary was mobilized, the remaining pedicle attachments to the mesosalpinx were then taken down and the left tube and ovary were removed with the aid of an Endo Catch bag. Next, the hysterectomy was performed in routine fashion. The cardinal broad ligament complexes were sequentially taken down using a clamping, coagulating and cutting technique with the Ace Harmonic scalpel. The bladder flap was created over the lower uterine segment through sharp and blunt dissection. The uterine vessels were skeletonized and then Kleppinger bipolar cautery was used to cauterize these vessels. The uterus was then transected from the cervix at the level of the uterosacral ligaments with the aid of the Ace Harmonic scalpel. Once this was mobilized, the morcellator was placed intra-abdominally and the morcellation was performed in standard fashion. Upon completion of the removal of the uterus, the pelvis was copiously irrigated. All pedicles were noted to be hemostatic. The previously placed V-Care instrument was removed and the endocervix  was cauterized using Kleppinger bipolar cautery. Once this was completed, final inspection of the pelvis was done with  verification of hemostasis and all then instrumentation was removed from the abdominopelvic cavity. Pneumoperitoneum was released. The incisions were closed with 0 Vicryl on the fascia and the 11 mm incision site. 4-0 Vicryl was used on the skin and Dermabond was placed over the incisions. The patient was then awakened, extubated, and taken to recovery in satisfactory condition.   ESTIMATED BLOOD LOSS: 50 mL.   IV FLUIDS: 1400 mL of crystalloid. The patient did receive Ancef 2 grams antibiotic prophylaxis.   ADDENDUM NOTE: Prior to patient being brought back to the operating room,  the patient was counseled regarding the FDA warning regarding use of morcellators in gynecologic surgery and the potential risk for disseminating leiomyosarcoma in less than 1 out of 500 cases. The patient accepted these risks and [facility]    ____________________________ Prentice DockerMartin A. Juris Gosnell, MD mad:aw D: 06/17/2012 11:57:30 ET T: 06/17/2012 12:41:05 ET JOB#: [id number]  cc: Daphine DeutscherMartin A. Breanna Mcdaniel, MD, <Dictator> [facility] Prentice DockerMARTIN A Tirza Senteno MD ELECTRONICALLY SIGNED 06/26/2012 8:56

## 2014-06-19 NOTE — H&P (Signed)
PATIENT NAME:  Carrie Graham, COCKERILL MR#:  [id number] DATE OF BIRTH:  November 02, 1970  DATE OF ADMISSION:  03/22/2013  HISTORY OF PRESENT ILLNESS: This 44 year old female called this morning, with chief complaint of redness surrounding her navel. She reports a history of umbilical hernia and just recently had surgery by Dr. Evette Cristal on 01/06, had laparoscopic umbilical hernia repair with insertion of mesh containing polypropylene. I reviewed his operative note, did have three incisions on the left side and also had four-point fixation with sutures placed through small stab wounds. The patient reports she had just some moderate discomfort during the postoperative period, but just over the last week, has developed some redness around the umbilicus, which she has realized this morning that it has spread out as far as the suture sites. So, it  about 4 inches across. She felt some malaise but did not take any breakfast this morning. Has had some moderate discomfort. Says with all the previous surgery she has had, does typically have some numbness in this area really so can not really experience pain very much, but has noted some discomfort in the right upper quadrant. She reports no specific chills or fever.   PAST MEDICAL HISTORY: Does include:  1. Insulin-dependent diabetes mellitus for which she takes sliding scale doses of Levemir twice a day and sliding scale doses of NovoLog 4 times a day.  2. She has a history of thrombocytopenia and anemia. Has received multiple iron infusions in the past and also platelet infusions in the past, but has not required this recently. 3.  Also has a prior history of obesity and weight loss and abdominoplasty.  4. History of irritable bowel. 5.  Gastroesophageal reflux disease.  6. History of ulcer.  7. Kidney stones.  8. Bleeding disorder.  9. Anemia.  10. Insomnia.   PAST SURGICAL HISTORY: Has had C-section, has had a left oophorectomy. Has had abdominal hysterectomy,  cholecystectomy, and abdominoplasty.   FAMILY HISTORY: Positive for ovarian cancer. Positive for diabetes.   SOCIAL HISTORY: Rarely smokes. Does not drink any alcohol.   MEDICATIONS: Include:  1. Xanax 0.5 mg 3 times a day.  2. Restoril 30 mg p.r.n. for sleep.  3. NovoLog 10 to 15 units 4 times a day.  4. Levemir 10 to 50 units 2 times per day.  5. Lexapro 20 mg daily.  6. Janumet 500/50 daily.  7. Tylenol p.r.n. for pain.  8. Has recently taken Dilaudid 1 mg q.4h. p.r.n. for pain.   DRUG ALLERGIES: SULFA, TORADOL, NSAIDS.   REVIEW OF SYSTEMS: Has had some recent sinus congestion. Does have some difficulty seeing when her blood sugar is high. She reports occasional heartburn. She is swallowing satisfactorily. Has noted a recent boil on the skin of the left buttock, which she just realized in the last few days she had that, although minimal discomfort. She reports breathing satisfactorily. She is somewhat anorexic and did not take breakfast this morning, but no nausea or vomiting. Says her stools are usually somewhat loose and has not passed any recent blood. She is voiding satisfactorily. Occasionally has some ankle edema.   PHYSICAL EXAMINATION: VITAL SIGNS: Temperature 98.2, pulse 81, respirations 18, blood pressure 124/72, pulse oximetry 96%. She is awake, alert and oriented. Height is 67 inches, weight 164 pounds.  SKIN: Warm and dry. Does have a small skin lesion of the left buttock just about 5 cm from the anal os which is felt 5 mm, rounded and soft, appears possibly it could be small  boil but it is not particularly tender. Does not appear to be fluctuant.  HEENT: Her pupils equal, reactive to light. Extraocular movements are intact. Sclerae clear. Pharynx clear.  NECK: No palpable mass.  LUNGS: Lungs sounds are clear.  HEART: Regular rhythm, S1 and S2.  ABDOMEN: With area of erythema, which is circular around the umbilicus and extending out about 5 cm from the umbilicus in all  directions. There is no significant tenderness. There is no fluctuance. I can identify the needle hole sites were she had sutures, and also identified the laparoscopic port sites on the left side, which appeared to have no erythema. Abdomen with mild right upper quadrant tenderness. There is no guarding.  EXTREMITIES: With no pedal edema.   NEUROLOGIC: Awake, alert and oriented, moving all extremities.   IMPRESSION:  1. Infection with cellulitis in the periumbilical area.  2. Recent laparoscopic umbilical hernia repair.   RECOMMENDATIONS: I recommended admission to the hospital, placed on intravenous Zosyn, discussed the possibility of infection. I also discussed possibility of infection involving mesh and recommended admission. Will anticipate monitor her blood sugars, put her on diabetic diet and notify Dr. Evette CristalSankar tomorrow on the patient's admission. We will do a CBC as well.   ____________________________ J. Renda RollsWilton Smith, MD jws:sg D: 03/22/2013 11:40:39 ET T: 03/22/2013 12:27:11 ET JOB#: [id number]  cc: Adella HareJ. Wilton Smith, MD, <Dictator> Adella HareWILTON J SMITH MD ELECTRONICALLY SIGNED 03/24/2013 9:03

## 2014-06-19 NOTE — Op Note (Signed)
PATIENT NAME:  Carrie Graham, Carrie Graham MR#:  [id number] DATE OF BIRTH:  1970-11-27  DATE OF PROCEDURE:  03/03/2013  PREOPERATIVE DIAGNOSIS: Umbilical hernia.   POSTOPERATIVE DIAGNOSIS: Umbilical hernia.   OPERATION: Laparoscopy and repair of umbilical hernia with mesh.   ANESTHESIA: General.   COMPLICATIONS: None.   ESTIMATED BLOOD LOSS: Minimal.   DRAINS: None.   DESCRIPTION OF PROCEDURE: The patient was put to sleep in the supine position on the operating table. The abdomen was prepped and draped out as a sterile field. Timeout procedure was performed. The initial entry was made in the left upper quadrant just below the costal margin. A small incision was made and a Veress needle with the InnerDyne sleeve was positioned in the peritoneal cavity and verified with the hanging drop method. Pneumoperitoneum was obtained and a 10 mm port was placed. The camera with an angled scope was introduced with good visualization of the abdominal wall and the peritoneal cavity, which was relatively benign in appearance with the omentum covering the bulk of the bowel. Additional two 5 mm ports were placed in the left side of the abdomen. In the region of the umbilicus, there was a slight bulge noted, and it appeared that the fascial defect contained primarily preperitoneal fatty tissue. Accordingly, this bulging, fatty tissue was removed, and the fascial defect was identified and exposed. After this was done, a Ventralight ST mesh, 4-1/2 inch circle was brought up. It was then introduced in the peritoneal cavity and pulled up against the abdominal wall through the central tubing through the umbilical defect and pulled up against the abdominal wall. The balloon system was inflated. Following this, the edges of the fascia were tacked down in a centimeter interval with the SecureStrap. The balloon then was deflated and removed. Four tiny stab incisions were made, one at the upper and one at the lower end and two at the  lateral ends of the mesh, and using a spinal needle device, 0 Prolene sutures were used  to affix the mesh to the fascia. After repair was completed, the pneumoperitoneum was released and the ports were removed. The skin incisions were closed with subcuticular 4-0 Vicryl, covered with Dermabond. The procedure was well tolerated. She was subsequently returned to the recovery room in stable condition.    ____________________________ S.Wynona LunaG. Sankar, MD sgs:dmm D: 03/03/2013 18:56:42 ET T: 03/03/2013 19:07:48 ET JOB#: [id number]  cc: Timoteo ExposeS.G. Evette CristalSankar, MD, <Dictator> [facility] Wynona LunaG SANKAR MD ELECTRONICALLY SIGNED 03/04/2013 8:57

## 2014-06-20 NOTE — Consult Note (Signed)
PATIENT NAME:  Carrie Graham, Carrie MR#:  [id number] DATE OF BIRTH:  [birth year]  DATE OF CONSULTATION:  03/02/2011  REFERRING PHYSICIAN:  Enedina Finnerandolph N. Manson PasseyBrown, MD CONSULTING PHYSICIAN:  Redge GainerMark A. Egbert GaribaldiBird, MD  REASON FOR CONSULTATION: I was asked to see this patient by Dr. Bayard Malesandolph Brown of the Emergency Room staff for evaluation of abdominal pain.   HISTORY OF PRESENT ILLNESS: This is a 44 year old healthy white female who had the abrupt onset of right lower quadrant and periumbilical pain starting at 8:00 this morning. Over the course of the day, the patient has had several episodes of nausea and vomiting, 5 to 6 loose stools. She has had a previous laparoscopic cholecystectomy and Cesarean section as well as abdominoplasty. While in the Emergency Room she was found to have a normal white count and normal urinalysis. A CT scan obtained with intravenous contrast demonstrates a normal appearing appendix with no signs of periappendiceal fat stranding. The appendix is of normal caliber. It is clearly visualized. Radiological interpretation is that of a normal appendix. The adnexa also normal. The patient just finished her menstrual cycle. She has had no history of ovarian cyst. No vaginal bleeding. No sick contacts. No recent travel. No unusual dietary indiscretions. The pain has progressed over the course of the entire day. It is constant in nature and located in the right lower quadrant with radiation into her lower back. No dysuria. No cough.   ALLERGIES: None.   ALLERGIES: Sulfa and Toradol.   HOME MEDICATIONS: Tylenol and Wellbutrin.   PAST MEDICAL HISTORY: None.   PAST SURGICAL HISTORY: Abdominoplasty, laparoscopic cholecystectomy, Cesarean section.   SOCIAL HISTORY: She is single. She works as a Engineer, civil (consulting)nurse at [employer].   FAMILY HISTORY: Noncontributory.   REVIEW OF SYSTEMS: As described above.   PHYSICAL EXAMINATION:  GENERAL: The patient is in no obvious distress.   VITAL SIGNS:  Temperature 98.4, pulse 88, blood pressure 152/79, respiratory rate 22, BMI 23.2, weight 148 pounds, 66.9 inches tall.   ABDOMEN: Abdomen demonstrates an abdominoplasty scar. It is soft but there is tenderness to deep palpation within the right lower quadrant. Negative Rovsing's sign.   EXTREMITIES: Warm and well perfused.   NEUROLOGIC / PSYCHIATRIC: Unremarkable. Affect is normal.   LUNGS: Clear bilaterally. Normal respiratory effort.   DIAGNOSTIC AND RADIOLOGICAL DATA: Review of CT scan is as described above and is personally reviewed on the PACS monitor.   IMPRESSION: Abdominal pain. No radiographic signs of appendicitis is seen.   RECOMMENDATIONS: The patient is stable for discharge. As she is a reliable patient and is a Engineer, civil (consulting)nurse, if she does not feel better than she will seek immediate medical attention at the local Emergency Room or back in our office. Contact information was given. Her blood sugar was elevated on the chemistry and this will be repeated in the Accu-Chek.   ____________________________ Redge GainerMark A. Egbert GaribaldiBird, MD mab:rbg D: 03/02/2011 04:23:55 ET T: 03/02/2011 10:17:31 ET JOB#: [id number]  cc: Loraine LericheMark A. Egbert GaribaldiBird, MD, <Dictator> Raynald KempMARK A Kline Bulthuis MD ELECTRONICALLY SIGNED 03/03/2011 13:16

## 2014-07-02 ENCOUNTER — Encounter: Payer: Self-pay | Admitting: Emergency Medicine

## 2014-07-02 ENCOUNTER — Emergency Department
Admission: EM | Admit: 2014-07-02 | Discharge: 2014-07-02 | Disposition: A | Discharge status: Home / Self Care | Payer: BLUE CROSS/BLUE SHIELD | Attending: Emergency Medicine | Admitting: Emergency Medicine

## 2014-07-02 DIAGNOSIS — Z720 Tobacco use: Secondary | ICD-10-CM | POA: Diagnosis not present

## 2014-07-02 DIAGNOSIS — R110 Nausea: Secondary | ICD-10-CM | POA: Insufficient documentation

## 2014-07-02 DIAGNOSIS — E119 Type 2 diabetes mellitus without complications: Secondary | ICD-10-CM | POA: Diagnosis not present

## 2014-07-02 DIAGNOSIS — J340 Abscess, furuncle and carbuncle of nose: Secondary | ICD-10-CM | POA: Insufficient documentation

## 2014-07-02 DIAGNOSIS — Z79899 Other long term (current) drug therapy: Secondary | ICD-10-CM | POA: Insufficient documentation

## 2014-07-02 DIAGNOSIS — Z792 Long term (current) use of antibiotics: Secondary | ICD-10-CM | POA: Insufficient documentation

## 2014-07-02 DIAGNOSIS — Z794 Long term (current) use of insulin: Secondary | ICD-10-CM | POA: Diagnosis not present

## 2014-07-02 DIAGNOSIS — R51 Headache: Secondary | ICD-10-CM | POA: Diagnosis not present

## 2014-07-02 DIAGNOSIS — L0291 Cutaneous abscess, unspecified: Secondary | ICD-10-CM

## 2014-07-02 DIAGNOSIS — R519 Headache, unspecified: Secondary | ICD-10-CM

## 2014-07-02 MED ORDER — MUPIROCIN 2 % EX OINT: TOPICAL_OINTMENT | CUTANEOUS | Status: AC

## 2014-07-02 MED ORDER — ACETAMINOPHEN 325 MG PO TABS
650.0000 mg | ORAL_TABLET | Freq: Once | ORAL | Status: AC
  Administered 2014-07-02: 650 mg via ORAL

## 2014-07-02 MED ORDER — DIPHENHYDRAMINE HCL 50 MG/ML IJ SOLN
INTRAMUSCULAR | Status: AC
  Administered 2014-07-02: 25 mg via INTRAVENOUS
  Filled 2014-07-02: qty 1

## 2014-07-02 MED ORDER — METOCLOPRAMIDE HCL 5 MG/ML IJ SOLN
INTRAMUSCULAR | Status: AC
  Administered 2014-07-02: 10 mg via INTRAVENOUS
  Filled 2014-07-02: qty 2

## 2014-07-02 MED ORDER — METOCLOPRAMIDE HCL 5 MG/ML IJ SOLN
10.0000 mg | Freq: Once | INTRAMUSCULAR | Status: AC
  Administered 2014-07-02: 10 mg via INTRAVENOUS

## 2014-07-02 MED ORDER — MORPHINE SULFATE 2 MG/ML IJ SOLN
INTRAMUSCULAR | Status: AC
  Administered 2014-07-02: 1 mg via INTRAVENOUS
  Filled 2014-07-02: qty 1

## 2014-07-02 MED ORDER — DOXYCYCLINE HYCLATE 100 MG PO TABS: 100.0000 mg | ORAL_TABLET | Freq: Two times a day (BID) | ORAL | Status: AC

## 2014-07-02 MED ORDER — DIPHENHYDRAMINE HCL 50 MG/ML IJ SOLN
25.0000 mg | Freq: Once | INTRAMUSCULAR | Status: AC
  Administered 2014-07-02: 25 mg via INTRAVENOUS

## 2014-07-02 MED ORDER — MORPHINE SULFATE 2 MG/ML IJ SOLN
1.0000 mg | Freq: Once | INTRAMUSCULAR | Status: AC
  Administered 2014-07-02: 1 mg via INTRAVENOUS

## 2014-07-02 MED ORDER — ACETAMINOPHEN 325 MG PO TABS
ORAL_TABLET | ORAL | Status: AC
  Administered 2014-07-02: 650 mg via ORAL
  Filled 2014-07-02: qty 2

## 2014-07-02 MED ORDER — ONDANSETRON HCL 4 MG PO TABS: 4.0000 mg | ORAL_TABLET | Freq: Every day | ORAL | Status: AC | PRN

## 2014-07-02 MED ORDER — LORATADINE 10 MG PO TABS: 10.0000 mg | ORAL_TABLET | Freq: Every day | ORAL | Status: AC

## 2014-07-02 MED ORDER — DOXYCYCLINE HYCLATE 100 MG PO TABS
100.0000 mg | ORAL_TABLET | Freq: Once | ORAL | Status: AC
  Administered 2014-07-02: 100 mg via ORAL
  Filled 2014-07-02: qty 1

## 2014-07-02 MED ORDER — HYDROCODONE-ACETAMINOPHEN 5-325 MG PO TABS: 1.0000 | ORAL_TABLET | Freq: Four times a day (QID) | ORAL | Status: DC | PRN

## 2014-07-02 NOTE — ED Notes (Signed)
Patient with no complaints at this time. Respirations even and unlabored. Skin warm/dry. Discharge instructions reviewed with patient at this time. Patient given opportunity to voice concerns/ask questions. IV removed per policy and band-aid applied to site. Patient discharged at this time and left Emergency Department with steady gait.  

## 2014-07-02 NOTE — ED Provider Notes (Signed)
[facility] Emergency Department Provider Note    ____________________________________________  Time seen: 2300  I have reviewed the triage vital signs and the nursing notes.   HISTORY  Chief Complaint Nose Problem       HPI Carrie Graham is a 44 y.o. female complaining of an abscess in her nose also having a sinus headache migraine headache abscesses in the left nares all distorted [name] is having 10 out of 10 throbbing headache pain nothing making it particularly better or worse other complaints at this time     Past Medical History  Diagnosis Date  . Anemia   . Insomnia   . IBS (irritable bowel syndrome)   . GERD (gastroesophageal reflux disease)   . Ulcer   . Kidney stones   . Bleeding disorder   . Diabetes mellitus without complication 2014    There are no active problems to display for this patient.   Past Surgical History  Procedure Laterality Date  . Abdominal hysterectomy  2014  . Cholecystectomy  1994  . Cesarean section  [id number]  . Abdominoplasty  2000  . Left oophorectomy  2014  . Hernia repair  03-03-13    umbilical hernia    Current Outpatient Rx  Name  Route  Sig  Dispense  Refill  . ALPRAZolam (XANAX) 0.5 MG tablet   Oral   Take 0.5 mg by mouth as needed for anxiety.         Marland Kitchen. doxycycline (VIBRA-TABS) 100 MG tablet   Oral   Take 1 tablet (100 mg total) by mouth 2 (two) times daily.   20 tablet   0   . escitalopram (LEXAPRO) 10 MG tablet   Oral   Take 10 mg by mouth daily.         Marland Kitchen. HYDROcodone-acetaminophen (NORCO) 5-325 MG per tablet   Oral   Take 1 tablet by mouth every 6 (six) hours as needed for moderate pain.   8 tablet   0   . Insulin Aspart (NOVOLOG [place])   Subcutaneous   Inject into the skin 3 (three) times daily.         . Insulin Detemir (LEVEMIR )   Subcutaneous   Inject into the skin 2 (two) times daily.         Marland Kitchen. loratadine (CLARITIN) 10 MG tablet   Oral  Take 1 tablet (10 mg total) by mouth daily.   30 tablet   2   . mupirocin ointment (BACTROBAN) 2 %      Apply to affected area 3 times daily   22 g   0   . ondansetron (ZOFRAN) 4 MG tablet   Oral   Take 1 tablet (4 mg total) by mouth daily as needed for nausea or vomiting.   15 tablet   1   . sitaGLIPtin (JANUVIA) 100 MG tablet   Oral   Take 100 mg by mouth daily.         . temazepam (RESTORIL) 15 MG capsule   Oral   Take 15 mg by mouth at bedtime as needed for sleep.           Allergies Nsaids; Sulfa antibiotics; and Toradol  Family History  Problem Relation Age of Onset  . Cancer Mother     ovarian    Social History History  Substance Use Topics  . Smoking status: Current Some Day Smoker  . Smokeless tobacco: Not on file  . Alcohol Use: No  Review of Systems  Constitutional: Negative for fever. Eyes: Negative for visual changes. ENT: Negative for sore throat. Cardiovascular: Negative for chest pain. Respiratory: Negative for shortness of breath. Gastrointestinal: Negative for abdominal pain, vomiting and diarrhea. Genitourinary: Negative for dysuria. Musculoskeletal: Negative for back pain. Skin: Negative for rash. Neurological: Negative for headaches, focal weakness or numbness.   10-point ROS otherwise negative.  ____________________________________________   PHYSICAL EXAM:  VITAL SIGNS: ED Triage Vitals  Enc Vitals Group     BP 07/02/14 2212 135/79 mmHg     Pulse Rate 07/02/14 2212 106     Resp 07/02/14 2212 20     Temp 07/02/14 2212 98.5 F (36.9 C)     Temp Source 07/02/14 2212 Oral     SpO2 07/02/14 2212 97 %     Weight 07/02/14 2212 147 lb (66.679 kg)     Height 07/02/14 2212 5\' 8"  (1.727 m)     Head Cir --      Peak Flow --      Pain Score 07/02/14 2213 10     Pain Loc --      Pain Edu? --      Excl. in GC? --      Constitutional: Alert and oriented. Well appearing and in no distress. Eyes: Conjunctivae are normal.  PERRL. Normal extraocular movements. ENT   Head: Normocephalic and atraumatic.  Ears: Bilateral serous otitis media   Nose: Patient has erythema and swelling in the left nares no focal abscess that I can see   Mouth/Throat: Mucous membranes are moist.   Neck: No stridor. Hematological/Lymphatic/Immunilogical: No cervical lymphadenopathy. Cardiovascular: Normal rate, regular rhythm. Normal and symmetric distal pulses are present in all extremities. No murmurs, rubs, or gallops. Respiratory: Normal respiratory effort without tachypnea nor retractions. Breath sounds are clear and equal bilaterally. No wheezes/rales/rhonchi.  Musculoskeletal: Nontender with normal range of motion in all extremities. No joint effusions.  No lower extremity tenderness nor edema. Neurologic:  Normal speech and language. No gross focal neurologic deficits are appreciated. Speech is normal. No gait instability. Skin:  Skin is warm, dry and intact. No rash noted. Psychiatric: Mood and affect are normal. Speech and behavior are normal. Patient exhibits appropriate insight and judgment.  ____________________________________________      PROCEDURES  Procedure(s) performed: None  Critical Care performed: No  ____________________________________________   INITIAL IMPRESSION / ASSESSMENT AND PLAN / ED COURSE  Pertinent labs & imaging results that were available during my care of the patient were reviewed by me and considered in my medical decision making (see chart for details).  Initial impression is headache nasal abscess sinusitis patient was given Reglan and Benadryl morphine and doxycycline with relief of symptoms in the department will be discharged home on doxycycline and antihistamines and Bactroban antibiotic ointment to follow-up with her doctor and to 3 days for recheck return if any acute concerns or worsening symptoms  ____________________________________________   FINAL CLINICAL  IMPRESSION(S) / ED DIAGNOSES  Final diagnoses:  Abscess  Sinus headache  Nausea    Carrie Graham Carrie GessWilliam C Tajae Rybicki, PA-C 07/02/14 2345  Arelia Longestavid M Schaevitz, MD 07/03/14 [phone]

## 2014-07-02 NOTE — Discharge Instructions (Signed)

## 2014-07-02 NOTE — ED Notes (Signed)
No bleeding and bruising noted. Pt states her (1)head, (2)neck, and (3)throat is painful at this time. Pt is laying in bed, wrapped in personal sweater in treatment room. Pt rates pain a 10/10 to affected areas listed above.

## 2014-07-02 NOTE — ED Notes (Signed)
Pt unable to E-Sign due to computer malfunction in treatment room 52.

## 2014-07-02 NOTE — ED Notes (Signed)
Pt presents to ER alert and in NAD. Pt reports she "popped a bump" in her left nare. Pt reports headache and sore throat.

## 2014-12-28 IMAGING — CR DG CHEST 1V PORT
1 series · 1 of 1 positions shown · non-contrast
Comparison: 10/15/2010

CLINICAL DATA: Cough.

EXAM:
PORTABLE CHEST - 1 VIEW

[ap]
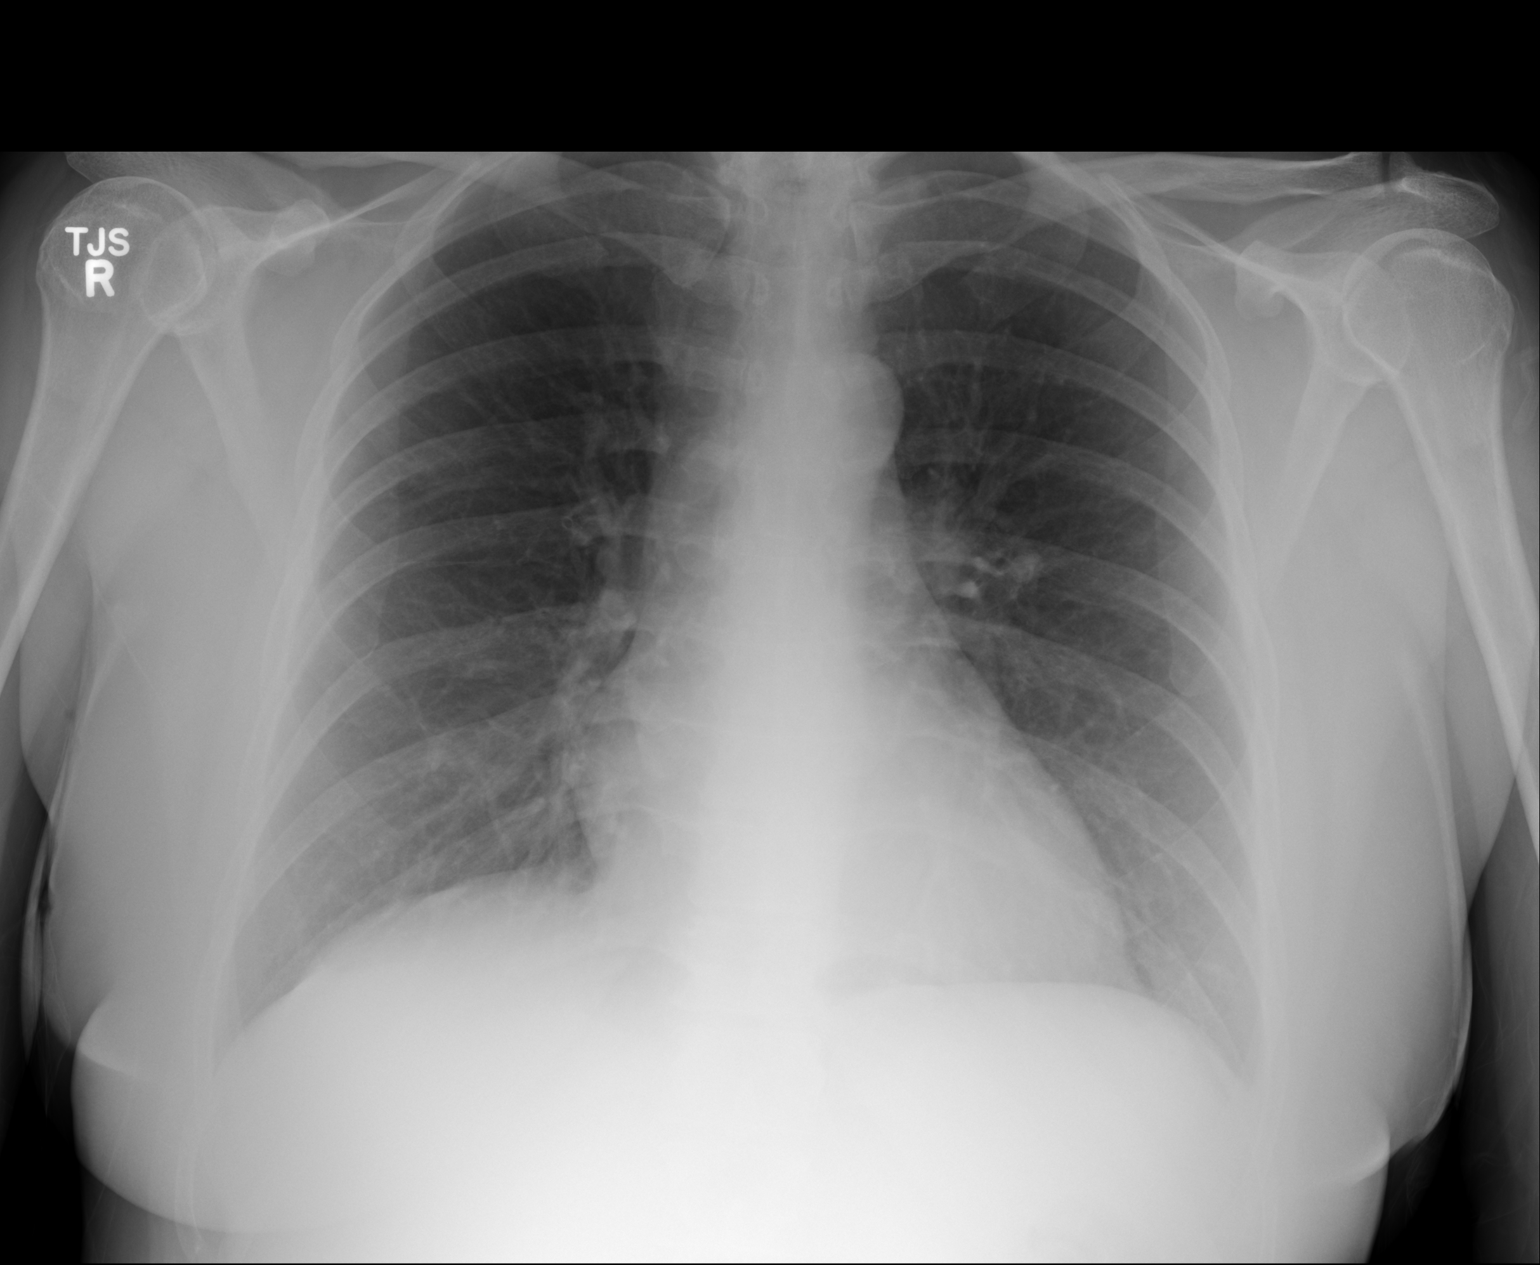

[1 of 1 positions shown; findings below may reference images not displayed]

FINDINGS: The heart size and mediastinal contours are within normal limits.
Both lungs are clear. The visualized skeletal structures are
unremarkable.
IMPRESSION: No active disease.

## 2015-05-10 ENCOUNTER — Emergency Department
Admission: EM | Admit: 2015-05-10 | Discharge: 2015-05-10 | Disposition: A | Discharge status: Home / Self Care | Payer: Commercial Managed Care - HMO | Attending: Emergency Medicine | Admitting: Emergency Medicine

## 2015-05-10 ENCOUNTER — Emergency Department: Payer: Commercial Managed Care - HMO

## 2015-05-10 ENCOUNTER — Encounter: Payer: Self-pay | Admitting: Emergency Medicine

## 2015-05-10 DIAGNOSIS — J209 Acute bronchitis, unspecified: Secondary | ICD-10-CM | POA: Diagnosis not present

## 2015-05-10 DIAGNOSIS — Z792 Long term (current) use of antibiotics: Secondary | ICD-10-CM | POA: Diagnosis not present

## 2015-05-10 DIAGNOSIS — E1165 Type 2 diabetes mellitus with hyperglycemia: Secondary | ICD-10-CM | POA: Insufficient documentation

## 2015-05-10 DIAGNOSIS — F17210 Nicotine dependence, cigarettes, uncomplicated: Secondary | ICD-10-CM | POA: Insufficient documentation

## 2015-05-10 DIAGNOSIS — Z3202 Encounter for pregnancy test, result negative: Secondary | ICD-10-CM | POA: Insufficient documentation

## 2015-05-10 DIAGNOSIS — R739 Hyperglycemia, unspecified: Secondary | ICD-10-CM

## 2015-05-10 DIAGNOSIS — Z794 Long term (current) use of insulin: Secondary | ICD-10-CM | POA: Insufficient documentation

## 2015-05-10 DIAGNOSIS — Z79899 Other long term (current) drug therapy: Secondary | ICD-10-CM | POA: Diagnosis not present

## 2015-05-10 DIAGNOSIS — R1013 Epigastric pain: Secondary | ICD-10-CM | POA: Insufficient documentation

## 2015-05-10 DIAGNOSIS — R509 Fever, unspecified: Secondary | ICD-10-CM | POA: Diagnosis present

## 2015-05-10 LAB — URINALYSIS COMPLETE WITH MICROSCOPIC (ARMC ONLY)
Bacteria, UA: NONE SEEN
Bilirubin Urine: NEGATIVE
Glucose, UA: >500 mg/dL — AB
HGB URINE DIPSTICK: NEGATIVE
KETONES UR: NEGATIVE mg/dL
LEUKOCYTES UA: NEGATIVE
Nitrite: NEGATIVE
PH: 6 (ref 5.0–8.0)
PROTEIN: NEGATIVE mg/dL
Specific Gravity, Urine: 1.033 — ABNORMAL HIGH (ref 1.005–1.030)

## 2015-05-10 LAB — LIPASE, BLOOD: Lipase: 39 U/L (ref 11–51)

## 2015-05-10 LAB — GLUCOSE, CAPILLARY
GLUCOSE-CAPILLARY: 223 mg/dL — AB (ref 65–99)
Glucose-Capillary: 378 mg/dL — ABNORMAL HIGH (ref 65–99)

## 2015-05-10 LAB — CBC
HCT: 42.6 % (ref 35.0–47.0)
Hemoglobin: 14.6 g/dL (ref 12.0–16.0)
MCH: 30 pg (ref 26.0–34.0)
MCHC: 34.3 g/dL (ref 32.0–36.0)
MCV: 87.4 fL (ref 80.0–100.0)
Platelets: 320 10*3/uL (ref 150–440)
RBC: 4.87 MIL/uL (ref 3.80–5.20)
RDW: 14 % (ref 11.5–14.5)
WBC: 6.1 10*3/uL (ref 3.6–11.0)

## 2015-05-10 LAB — COMPREHENSIVE METABOLIC PANEL
ALBUMIN: 3.8 g/dL (ref 3.5–5.0)
ALK PHOS: 227 U/L — AB (ref 38–126)
ALT: 51 U/L (ref 14–54)
ANION GAP: 7 (ref 5–15)
AST: 52 U/L — ABNORMAL HIGH (ref 15–41)
BUN: 9 mg/dL (ref 6–20)
CHLORIDE: 100 mmol/L — AB (ref 101–111)
CO2: 24 mmol/L (ref 22–32)
CREATININE: 0.59 mg/dL (ref 0.44–1.00)
Calcium: 8.7 mg/dL — ABNORMAL LOW (ref 8.9–10.3)
GFR calc non Af Amer: >60 mL/min (ref >60)
Glucose, Bld: 348 mg/dL — ABNORMAL HIGH (ref 65–99)
Potassium: 3.6 mmol/L (ref 3.5–5.1)
SODIUM: 131 mmol/L — AB (ref 135–145)
TOTAL PROTEIN: 8.1 g/dL (ref 6.5–8.1)
Total Bilirubin: 0.5 mg/dL (ref 0.3–1.2)

## 2015-05-10 LAB — RAPID INFLUENZA A&B ANTIGENS
Influenza A (ARMC): NEGATIVE
Influenza B (ARMC): NEGATIVE

## 2015-05-10 LAB — POCT PREGNANCY, URINE: Preg Test, Ur: NEGATIVE

## 2015-05-10 MED ORDER — AZITHROMYCIN 500 MG PO TABS
500.0000 mg | ORAL_TABLET | Freq: Every day | ORAL | Status: AC
Start: 2015-05-10 — End: 2015-05-13

## 2015-05-10 MED ORDER — INSULIN ASPART 100 UNIT/ML ~~LOC~~ SOLN
8.0000 [IU] | Freq: Once | SUBCUTANEOUS | Status: DC
  Filled 2015-05-10: qty 8

## 2015-05-10 MED ORDER — SODIUM CHLORIDE 0.9 % IV BOLUS (SEPSIS)
1000.0000 mL | Freq: Once | INTRAVENOUS | Status: AC
  Administered 2015-05-10: 1000 mL via INTRAVENOUS

## 2015-05-10 MED ORDER — INSULIN DETEMIR 100 UNIT/ML ~~LOC~~ SOLN: 14.0000 [IU] | Freq: Two times a day (BID) | SUBCUTANEOUS | Status: AC

## 2015-05-10 MED ORDER — INSULIN ASPART 100 UNIT/ML ~~LOC~~ SOLN: SUBCUTANEOUS | Status: AC

## 2015-05-10 NOTE — ED Notes (Addendum)
Pt presents to ED with fever (102.4), nasal congestion, and "dry" cough since "last week". Pt denies n/v/d. Not feeling better tonight. Took Tylenol around 0130. Pt also states she is diabetic and has been off her insulin since the end of January after losing her job. Pt states she has noticed left side pain for the past few week but pain increased last week. states she is concerned about her pancreas. Has a hx of pancreatitis.

## 2015-05-10 NOTE — ED Provider Notes (Signed)
[facility] Emergency Department Provider Note  ____________________________________________  Time seen: 5:00 AM  I have reviewed the triage vital signs and the nursing notes.   HISTORY  Chief Complaint Fever; Nasal Congestion; Cough; and Abdominal Pain      HPI Carrie Graham is a 45 y.o. female with history of diabetes presents with fever 102.4 as well as nasal congestion and nonproductive cough times one week. Patient also admits to epigastric/left lower quadrant abdominal pain consistent with previous episodes of pancreatitis. Patient also admits to being noncompliant with her insulin secondary to cost and insurance reasons.     Past Medical History  Diagnosis Date  . Anemia   . Insomnia   . IBS (irritable bowel syndrome)   . GERD (gastroesophageal reflux disease)   . Ulcer   . Kidney stones   . Bleeding disorder (HCC)   . Diabetes mellitus without complication (HCC) 2014    There are no active problems to display for this patient.   Past Surgical History  Procedure Laterality Date  . Abdominal hysterectomy  2014  . Cholecystectomy  1994  . Cesarean section  [id number]  . Abdominoplasty  2000  . Left oophorectomy  2014  . Hernia repair  03-03-13    umbilical hernia  . Shoulder surgery      Current Outpatient Rx  Name  Route  Sig  Dispense  Refill  . ALPRAZolam (XANAX) 0.5 MG tablet   Oral   Take 0.5 mg by mouth as needed for anxiety.         Marland Kitchen doxycycline (VIBRA-TABS) 100 MG tablet   Oral   Take 1 tablet (100 mg total) by mouth 2 (two) times daily.   20 tablet   0   . escitalopram (LEXAPRO) 10 MG tablet   Oral   Take 10 mg by mouth daily.         Marland Kitchen HYDROcodone-acetaminophen (NORCO) 5-325 MG per tablet   Oral   Take 1 tablet by mouth every 6 (six) hours as needed for moderate pain.   8 tablet   0   . Insulin Aspart (NOVOLOG [place])   Subcutaneous   Inject into the skin 3 (three) times daily.         . Insulin Detemir  (LEVEMIR )   Subcutaneous   Inject into the skin 2 (two) times daily.         Marland Kitchen loratadine (CLARITIN) 10 MG tablet   Oral   Take 1 tablet (10 mg total) by mouth daily.   30 tablet   2   . mupirocin ointment (BACTROBAN) 2 %      Apply to affected area 3 times daily   22 g   0   . ondansetron (ZOFRAN) 4 MG tablet   Oral   Take 1 tablet (4 mg total) by mouth daily as needed for nausea or vomiting.   15 tablet   1   . sitaGLIPtin (JANUVIA) 100 MG tablet   Oral   Take 100 mg by mouth daily.         . temazepam (RESTORIL) 15 MG capsule   Oral   Take 15 mg by mouth at bedtime as needed for sleep.           Allergies Nsaids; Sulfa antibiotics; and Toradol  Family History  Problem Relation Age of Onset  . Cancer Mother     ovarian    Social History Social History  Substance Use Topics  . Smoking  status: Current Every Day Smoker -- 0.50 packs/day    Types: Cigarettes  . Smokeless tobacco: Not on file  . Alcohol Use: No    Review of Systems  Constitutional: Positive for fever. Eyes: Negative for visual changes. ENT: Negative for sore throat. Cardiovascular: Negative for chest pain. Respiratory: Negative for shortness of breath. Positive for cough Gastrointestinal: Positive for abdominal pain. Genitourinary: Negative for dysuria. Musculoskeletal: Negative for back pain. Skin: Negative for rash. Neurological: Negative for headaches, focal weakness or numbness.   10-point ROS otherwise negative.  ____________________________________________   PHYSICAL EXAM:  VITAL SIGNS: ED Triage Vitals  Enc Vitals Group     BP 05/10/15 0314 137/69 mmHg     Pulse Rate 05/10/15 0314 84     Resp 05/10/15 0314 18     Temp 05/10/15 0314 98.8 F (37.1 C)     Temp Source 05/10/15 0314 Oral     SpO2 05/10/15 0314 97 %     Weight 05/10/15 0314 146 lb (66.225 kg)     Height 05/10/15 0314 5\' 7"  (1.702 m)     Head Cir --      Peak Flow --      Pain Score 05/10/15  0314 6     Pain Loc --      Pain Edu? --      Excl. in GC? --     Constitutional: Alert and oriented. Well appearing and in no distress. Eyes: Conjunctivae are normal. PERRL. Normal extraocular movements. ENT   Head: Normocephalic and atraumatic.   Nose: No congestion/rhinnorhea.   Mouth/Throat: Mucous membranes are moist.   Neck: No stridor. Hematological/Lymphatic/Immunilogical: No cervical lymphadenopathy. Cardiovascular: Normal rate, regular rhythm. Normal and symmetric distal pulses are present in all extremities. No murmurs, rubs, or gallops. Respiratory: Normal respiratory effort without tachypnea nor retractions. Breath sounds are clear and equal bilaterally. No wheezes/rales/rhonchi. Gastrointestinal: Epigastric/left upper quadrant tenderness to palpation. No distention. There is no CVA tenderness. Genitourinary: deferred Musculoskeletal: Nontender with normal range of motion in all extremities. No joint effusions.  No lower extremity tenderness nor edema. Neurologic:  Normal speech and language. No gross focal neurologic deficits are appreciated. Speech is normal.  Skin:  Skin is warm, dry and intact. No rash noted. Psychiatric: Mood and affect are normal. Speech and behavior are normal. Patient exhibits appropriate insight and judgment.  ____________________________________________    LABS (pertinent positives/negatives)  Labs Reviewed  COMPREHENSIVE METABOLIC PANEL - Abnormal; Notable for the following:    Sodium 131 (*)    Chloride 100 (*)    Glucose, Bld 348 (*)    Calcium 8.7 (*)    AST 52 (*)    Alkaline Phosphatase 227 (*)    All other components within normal limits  URINALYSIS COMPLETEWITH MICROSCOPIC ([facility] ONLY) - Abnormal; Notable for the following:    Color, Urine YELLOW (*)    APPearance CLEAR (*)    Glucose, UA >500 (*)    Specific Gravity, Urine 1.033 (*)    Squamous Epithelial / LPF 0-5 (*)    All other components within normal limits   GLUCOSE, CAPILLARY - Abnormal; Notable for the following:    Glucose-Capillary 378 (*)    All other components within normal limits  GLUCOSE, CAPILLARY - Abnormal; Notable for the following:    Glucose-Capillary 223 (*)    All other components within normal limits  RAPID INFLUENZA A&B ANTIGENS ([facility] ONLY)  CBC  LIPASE, BLOOD  POCT PREGNANCY, URINE      RADIOLOGY  DG Chest 2 View (Final result) Result time: 05/10/15 05:02:02   Final result by Rad Results In Interface (05/10/15 05:02:02)   Narrative:   CLINICAL DATA: Cough and fever  EXAM: CHEST 2 VIEW  COMPARISON: 06/03/2013  FINDINGS: Normal heart size and mediastinal contours. No acute infiltrate or edema. No effusion or pneumothorax. No osseous findings. Cholecystectomy clips.  IMPRESSION: No pneumonia.   Electronically Signed By: Marnee Spring M.D. On: 05/10/2015 05:02          INITIAL IMPRESSION / ASSESSMENT AND PLAN / ED COURSE  Pertinent labs & imaging results that were available during my care of the patient were reviewed by me and considered in my medical decision making (see chart for details).  Patient received 1 L IV normal saline with improvement in hyperglycemia. Chest x-ray revealed no evidence of pneumonia suspect acute bronchitis in a smoker patient received azithromycin 500 mg  ____________________________________________   FINAL CLINICAL IMPRESSION(S) / ED DIAGNOSES  Final diagnoses:  Hyperglycemia  Acute bronchitis, unspecified organism      Darci Current, MD 05/10/15 [phone]

## 2015-05-10 NOTE — Discharge Instructions (Signed)
Hyperglycemia °Hyperglycemia occurs when the glucose (sugar) in your blood is too high. Hyperglycemia can happen for many reasons, but it most often happens to people who do not know they have diabetes or are not managing their diabetes properly.  °CAUSES  °Whether you have diabetes or not, there are other causes of hyperglycemia. Hyperglycemia can occur when you have diabetes, but it can also occur in other situations that you might not be as aware of, such as: °Diabetes °· If you have diabetes and are having problems controlling your blood glucose, hyperglycemia could occur because of some of the following reasons: °· Not following your meal plan. °· Not taking your diabetes medications or not taking it properly. °· Exercising less or doing less activity than you normally do. °· Being sick. °Pre-diabetes °· This cannot be ignored. Before people develop Type 2 diabetes, they almost always have "pre-diabetes." This is when your blood glucose levels are higher than normal, but not yet high enough to be diagnosed as diabetes. Research has shown that some long-term damage to the body, especially the heart and circulatory system, may already be occurring during pre-diabetes. If you take action to manage your blood glucose when you have pre-diabetes, you may delay or prevent Type 2 diabetes from developing. °Stress °· If you have diabetes, you may be "diet" controlled or on oral medications or insulin to control your diabetes. However, you may find that your blood glucose is higher than usual in the hospital whether you have diabetes or not. This is often referred to as "stress hyperglycemia." Stress can elevate your blood glucose. This happens because of hormones put out by the body during times of stress. If stress has been the cause of your high blood glucose, it can be followed regularly by your caregiver. That way he/she can make sure your hyperglycemia does not continue to get worse or progress to  diabetes. °Steroids °· Steroids are medications that act on the infection fighting system (immune system) to block inflammation or infection. One side effect can be a rise in blood glucose. Most people can produce enough extra insulin to allow for this rise, but for those who cannot, steroids make blood glucose levels go even higher. It is not unusual for steroid treatments to "uncover" diabetes that is developing. It is not always possible to determine if the hyperglycemia will go away after the steroids are stopped. A special blood test called an A1c is sometimes done to determine if your blood glucose was elevated before the steroids were started. °SYMPTOMS °· Thirsty. °· Frequent urination. °· Dry mouth. °· Blurred vision. °· Tired or fatigue. °· Weakness. °· Sleepy. °· Tingling in feet or leg. °DIAGNOSIS  °Diagnosis is made by monitoring blood glucose in one or all of the following ways: °· A1c test. This is a chemical found in your blood. °· Fingerstick blood glucose monitoring. °· Laboratory results. °TREATMENT  °First, knowing the cause of the hyperglycemia is important before the hyperglycemia can be treated. Treatment may include, but is not be limited to: °· Education. °· Change or adjustment in medications. °· Change or adjustment in meal plan. °· Treatment for an illness, infection, etc. °· More frequent blood glucose monitoring. °· Change in exercise plan. °· Decreasing or stopping steroids. °· Lifestyle changes. °HOME CARE INSTRUCTIONS  °· Test your blood glucose as directed. °· Exercise regularly. Your caregiver will give you instructions about exercise. Pre-diabetes or diabetes which comes on with stress is helped by exercising. °· Eat wholesome,   balanced meals. Eat often and at regular, fixed times. Your caregiver or nutritionist will give you a meal plan to guide your sugar intake.  Being at an ideal weight is important. If needed, losing as little as 10 to 15 pounds may help improve blood  glucose levels. SEEK MEDICAL CARE IF:   You have questions about medicine, activity, or diet.  You continue to have symptoms (problems such as increased thirst, urination, or weight gain). SEEK IMMEDIATE MEDICAL CARE IF:   You are vomiting or have diarrhea.  Your breath smells fruity.  You are breathing faster or slower.  You are very sleepy or incoherent.  You have numbness, tingling, or pain in your feet or hands.  You have chest pain.  Your symptoms get worse even though you have been following your caregiver's orders.  If you have any other questions or concerns.   This information is not intended to replace advice given to you by your health care provider. Make sure you discuss any questions you have with your health care provider.   Document Released: 08/08/2000 Document Revised: 05/07/2011 Document Reviewed: 10/19/2014 Elsevier Interactive Patient Education 2016 [employer]  Acute Bronchitis Bronchitis is inflammation of the airways that extend from the windpipe into the lungs (bronchi). The inflammation often causes mucus to develop. This leads to a cough, which is the most common symptom of bronchitis.  In acute bronchitis, the condition usually develops suddenly and goes away over time, usually in a couple weeks. Smoking, allergies, and asthma can make bronchitis worse. Repeated episodes of bronchitis may cause further lung problems.  CAUSES Acute bronchitis is most often caused by the same virus that causes a cold. The virus can spread from person to person (contagious) through coughing, sneezing, and touching contaminated objects. SIGNS AND SYMPTOMS   Cough.   Fever.   Coughing up mucus.   Body aches.   Chest congestion.   Chills.   Shortness of breath.   Sore throat.  DIAGNOSIS  Acute bronchitis is usually diagnosed through a physical exam. Your health care provider will also ask you questions about your medical history. Tests, such as chest  X-rays, are sometimes done to rule out other conditions.  TREATMENT  Acute bronchitis usually goes away in a couple weeks. Oftentimes, no medical treatment is necessary. Medicines are sometimes given for relief of fever or cough. Antibiotic medicines are usually not needed but may be prescribed in certain situations. In some cases, an inhaler may be recommended to help reduce shortness of breath and control the cough. A cool mist vaporizer may also be used to help thin bronchial secretions and make it easier to clear the chest.  HOME CARE INSTRUCTIONS  Get plenty of rest.   Drink enough fluids to keep your urine clear or pale yellow (unless you have a medical condition that requires fluid restriction). Increasing fluids may help thin your respiratory secretions (sputum) and reduce chest congestion, and it will prevent dehydration.   Take medicines only as directed by your health care provider.  If you were prescribed an antibiotic medicine, finish it all even if you start to feel better.  Avoid smoking and secondhand smoke. Exposure to cigarette smoke or irritating chemicals will make bronchitis worse. If you are a smoker, consider using nicotine gum or skin patches to help control withdrawal symptoms. Quitting smoking will help your lungs heal faster.   Reduce the chances of another bout of acute bronchitis by washing your hands frequently, avoiding people with cold  symptoms, and trying not to touch your hands to your mouth, nose, or eyes.   Keep all follow-up visits as directed by your health care provider.  SEEK MEDICAL CARE IF: Your symptoms do not improve after 1 week of treatment.  SEEK IMMEDIATE MEDICAL CARE IF:  You develop an increased fever or chills.   You have chest pain.   You have severe shortness of breath.  You have bloody sputum.   You develop dehydration.  You faint or repeatedly feel like you are going to pass out.  You develop repeated vomiting.  You  develop a severe headache. MAKE SURE YOU:   Understand these instructions.  Will watch your condition.  Will get help right away if you are not doing well or get worse.   This information is not intended to replace advice given to you by your health care provider. Make sure you discuss any questions you have with your health care provider.   Document Released: 03/22/2004 Document Revised: 03/05/2014 Document Reviewed: 08/05/2012 Elsevier Interactive Patient Education [employer]

## 2015-07-01 ENCOUNTER — Ambulatory Visit: Payer: Self-pay | Admitting: Unknown Physician Specialty

## 2015-07-13 ENCOUNTER — Encounter: Payer: Self-pay | Admitting: Urgent Care

## 2015-07-13 ENCOUNTER — Emergency Department
Admission: EM | Admit: 2015-07-13 | Discharge: 2015-07-14 | Disposition: A | Discharge status: Home / Self Care | Payer: Self-pay | Attending: Emergency Medicine | Admitting: Emergency Medicine

## 2015-07-13 DIAGNOSIS — Z794 Long term (current) use of insulin: Secondary | ICD-10-CM | POA: Insufficient documentation

## 2015-07-13 DIAGNOSIS — E119 Type 2 diabetes mellitus without complications: Secondary | ICD-10-CM | POA: Insufficient documentation

## 2015-07-13 DIAGNOSIS — R109 Unspecified abdominal pain: Secondary | ICD-10-CM

## 2015-07-13 DIAGNOSIS — F17210 Nicotine dependence, cigarettes, uncomplicated: Secondary | ICD-10-CM | POA: Insufficient documentation

## 2015-07-13 DIAGNOSIS — N281 Cyst of kidney, acquired: Secondary | ICD-10-CM | POA: Insufficient documentation

## 2015-07-13 LAB — URINALYSIS COMPLETE WITH MICROSCOPIC (ARMC ONLY)
BACTERIA UA: NONE SEEN
BILIRUBIN URINE: NEGATIVE
Glucose, UA: >500 mg/dL — AB
HGB URINE DIPSTICK: NEGATIVE
Leukocytes, UA: NEGATIVE
Nitrite: NEGATIVE
Protein, ur: NEGATIVE mg/dL
Specific Gravity, Urine: 1.032 — ABNORMAL HIGH (ref 1.005–1.030)
pH: 5 (ref 5.0–8.0)

## 2015-07-13 MED ORDER — ONDANSETRON HCL 4 MG/2ML IJ SOLN
4.0000 mg | Freq: Once | INTRAMUSCULAR | Status: AC
  Administered 2015-07-14: 4 mg via INTRAVENOUS

## 2015-07-13 MED ORDER — SODIUM CHLORIDE 0.9 % IV BOLUS (SEPSIS)
1000.0000 mL | Freq: Once | INTRAVENOUS | Status: AC
  Administered 2015-07-14: 1000 mL via INTRAVENOUS

## 2015-07-13 MED ORDER — HYDROMORPHONE HCL 1 MG/ML IJ SOLN
1.0000 mg | Freq: Once | INTRAMUSCULAR | Status: AC
  Administered 2015-07-14: 1 mg via INTRAVENOUS

## 2015-07-13 NOTE — ED Provider Notes (Signed)
[facility] Emergency Department Provider Note   ____________________________________________  Time seen: Approximately 11:43 PM  I have reviewed the triage vital signs and the nursing notes.   HISTORY  Chief Complaint Flank Pain    HPI Carrie Graham is a 45 y.o. female who presents to the ED from home with a chief complaint of right flank pain. Patient has a history of kidney stones requiring lithotripsy. Onset of right flank pain approximately 3 PM. Patient was working in the yard and thought she strained her back. Pain increased approximately 8 PM associated with nausea and vomiting. Patient denies fever, chills, chest pain, shortness of breath, urinary symptoms. Denies recent travel or trauma. Nothing makes her pain better or worse.States this feels like similar pain to her prior kidney stones.   Past Medical History  Diagnosis Date  . Anemia   . Insomnia   . IBS (irritable bowel syndrome)   . GERD (gastroesophageal reflux disease)   . Ulcer   . Kidney stones   . Bleeding disorder (HCC)   . Diabetes mellitus without complication (HCC) 2014    There are no active problems to display for this patient.   Past Surgical History  Procedure Laterality Date  . Abdominal hysterectomy  2014  . Cholecystectomy  1994  . Cesarean section  [id number]  . Abdominoplasty  2000  . Left oophorectomy  2014  . Hernia repair  03-03-13    umbilical hernia  . Shoulder surgery      Current Outpatient Rx  Name  Route  Sig  Dispense  Refill  . ALPRAZolam (XANAX) 0.5 MG tablet   Oral   Take 0.5 mg by mouth as needed for anxiety.         Marland Kitchen doxycycline (VIBRA-TABS) 100 MG tablet   Oral   Take 1 tablet (100 mg total) by mouth 2 (two) times daily.   20 tablet   0   . escitalopram (LEXAPRO) 10 MG tablet   Oral   Take 10 mg by mouth daily.         Marland Kitchen HYDROcodone-acetaminophen (NORCO) 5-325 MG per tablet   Oral   Take 1 tablet by mouth every 6 (six)  hours as needed for moderate pain.   8 tablet   0   . Insulin Aspart (NOVOLOG [place])   Subcutaneous   Inject into the skin 3 (three) times daily.         Marland Kitchen EXPIRED: insulin aspart (NOVOLOG) 100 UNIT/ML injection      Per sliding scale   10 mL   3   . Insulin Detemir (LEVEMIR [place])   Subcutaneous   Inject into the skin 2 (two) times daily.         . insulin detemir (LEVEMIR) 100 UNIT/ML injection   Subcutaneous   Inject 0.14 mLs (14 Units total) into the skin 2 (two) times daily.   10 mL   3   . EXPIRED: loratadine (CLARITIN) 10 MG tablet   Oral   Take 1 tablet (10 mg total) by mouth daily.   30 tablet   2   . ondansetron (ZOFRAN ODT) 4 MG disintegrating tablet   Oral   Take 1 tablet (4 mg total) by mouth every 8 (eight) hours as needed for nausea or vomiting.   20 tablet   0   . oxyCODONE-acetaminophen (ROXICET) 5-325 MG tablet   Oral   Take 1 tablet by mouth every 4 (four) hours as needed for severe pain.  20 tablet   0   . sitaGLIPtin (JANUVIA) 100 MG tablet   Oral   Take 100 mg by mouth daily.         . temazepam (RESTORIL) 15 MG capsule   Oral   Take 15 mg by mouth at bedtime as needed for sleep.           Allergies Nsaids; Sulfa antibiotics; and Toradol  Family History  Problem Relation Age of Onset  . Cancer Mother     ovarian    Social History Social History  Substance Use Topics  . Smoking status: Current Every Day Smoker -- 0.50 packs/day    Types: Cigarettes  . Smokeless tobacco: None  . Alcohol Use: No    Review of Systems  Constitutional: No fever/chills. Eyes: No visual changes. ENT: No sore throat. Cardiovascular: Denies chest pain. Respiratory: Denies shortness of breath. Gastrointestinal: Positive for right flank pain. No abdominal pain.  Positive for nausea and vomiting.  No diarrhea.  No constipation. Genitourinary: Negative for dysuria. Musculoskeletal: Negative for back pain. Skin: Negative for  rash. Neurological: Negative for headaches, focal weakness or numbness.  10-point ROS otherwise negative.  ____________________________________________   PHYSICAL EXAM:  VITAL SIGNS: ED Triage Vitals  Enc Vitals Group     BP 07/13/15 2126 128/75 mmHg     Pulse Rate 07/13/15 2126 60     Resp 07/13/15 2126 18     Temp 07/13/15 2126 98.7 F (37.1 C)     Temp Source 07/13/15 2126 Oral     SpO2 07/13/15 2126 97 %     Weight 07/13/15 2126 148 lb (67.132 kg)     Height 07/13/15 2126  (1.702 m)     Head Cir --      Peak Flow --      Pain Score 07/13/15 2127 10     Pain Loc --      Pain Edu? --      Excl. in GC? --     Constitutional: Alert and oriented. Well appearing and in moderate acute distress. Eyes: Conjunctivae are normal. PERRL. EOMI. Head: Atraumatic. Nose: No congestion/rhinnorhea. Mouth/Throat: Mucous membranes are moist.  Oropharynx non-erythematous. Neck: No stridor.   Cardiovascular: Normal rate, regular rhythm. Grossly normal heart sounds.  Good peripheral circulation. Respiratory: Normal respiratory effort.  No retractions. Lungs CTAB. Gastrointestinal: Soft and mildly tender to palpation right abdomen without rebound or guarding. No distention. No abdominal bruits. Mild right CVA tenderness. Musculoskeletal: No lower extremity tenderness nor edema.  No joint effusions. Neurologic:  Normal speech and language. No gross focal neurologic deficits are appreciated. No gait instability. Skin:  Skin is warm, dry and intact. No rash noted. Psychiatric: Mood and affect are normal. Speech and behavior are normal.  ____________________________________________   LABS (all labs ordered are listed, but only abnormal results are displayed)  Labs Reviewed  URINALYSIS COMPLETEWITH MICROSCOPIC ([facility] ONLY) - Abnormal; Notable for the following:    Color, Urine YELLOW (*)    APPearance CLEAR (*)    Glucose, UA >500 (*)    Ketones, ur TRACE (*)    Specific Gravity,  Urine 1.032 (*)    Squamous Epithelial / LPF 0-5 (*)    All other components within normal limits  CBC WITH DIFFERENTIAL/PLATELET - Abnormal; Notable for the following:    WBC 17.8 (*)    Neutro Abs 15.3 (*)    All other components within normal limits  BASIC METABOLIC PANEL - Abnormal; Notable for the  following:    Sodium 134 (*)    Chloride 100 (*)    Glucose, Bld 310 (*)    All other components within normal limits   ____________________________________________  EKG  None ____________________________________________  RADIOLOGY  CT Renal Study interpreted per Dr. Gwenyth Benderadparvar: No hydronephrosis or nephrolithiasis.  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 Ultrasound is recommended for further evaluation.  No evidence of bowel obstruction or inflammation. Normal appendix.  Renal US interpreted per Dr. Gwenyth Benderadparvar: No hydronephrosis or echogenic stone.  Right renal parapelvic cyst.  Fatty liver. ____________________________________________   PROCEDURES  Procedure(s) performed: None  Critical Care performed: No  ____________________________________________   INITIAL IMPRESSION / ASSESSMENT AND PLAN / ED COURSE  Pertinent labs & imaging results that were available during my care of the patient were reviewed by me and considered in my medical decision making (see chart for details).  45 year old female who presents with right flank pain, nausea and vomiting; history of nephrolithiasis. Will initiate IV fluid resuscitation, IV analgesia and obtain CT renal colic study as it has been several years since her last imaging.  ----------------------------------------- 1:17 AM on 07/14/2015 -----------------------------------------  Pain returning. Updated patient and daughter of CT results negative for stone. Will obtain renal ultrasound to further characterize right parapelvic cyst seen on CT scan. Noted leukocytosis most likely secondary to stress reaction from  pain.  ----------------------------------------- 3:15 AM on 07/14/2015 -----------------------------------------  Updated patient on ultrasound results. Discussed that she is likely experiencing pain from tiny kidney stone not characterized on CT scan. Will prescribe limited quantity analgesia and antiemetic, and patient will follow-up with urology. Strict return precautions given. Patient verbalizes understanding and agrees with plan of care. ____________________________________________   FINAL CLINICAL IMPRESSION(S) / ED DIAGNOSES  Final diagnoses:  Right flank pain  Renal cyst      NEW MEDICATIONS STARTED DURING THIS VISIT:  New Prescriptions   ONDANSETRON (ZOFRAN ODT) 4 MG DISINTEGRATING TABLET    Take 1 tablet (4 mg total) by mouth every 8 (eight) hours as needed for nausea or vomiting.   OXYCODONE-ACETAMINOPHEN (ROXICET) 5-325 MG TABLET    Take 1 tablet by mouth every 4 (four) hours as needed for severe pain.     Note:  This document was prepared using Dragon voice recognition software and may include unintentional dictation errors.    Irean HongJade J Sung, MD 07/14/15 [phone]

## 2015-07-13 NOTE — ED Notes (Signed)
Patient presents with acute onset RIGHT lower back pain with (+) radiation into flank. (+) N/V reported. Denies urinary symptoms that she can tell.

## 2015-07-14 ENCOUNTER — Emergency Department: Payer: Self-pay

## 2015-07-14 LAB — BASIC METABOLIC PANEL
ANION GAP: 9 (ref 5–15)
BUN: 13 mg/dL (ref 6–20)
CO2: 25 mmol/L (ref 22–32)
Calcium: 9.4 mg/dL (ref 8.9–10.3)
Chloride: 100 mmol/L — ABNORMAL LOW (ref 101–111)
Creatinine, Ser: 0.69 mg/dL (ref 0.44–1.00)
GFR calc Af Amer: >60 mL/min (ref >60)
Glucose, Bld: 310 mg/dL — ABNORMAL HIGH (ref 65–99)
POTASSIUM: 3.9 mmol/L (ref 3.5–5.1)
SODIUM: 134 mmol/L — AB (ref 135–145)

## 2015-07-14 LAB — CBC WITH DIFFERENTIAL/PLATELET
BASOS ABS: 0.1 10*3/uL (ref 0–0.1)
Basophils Relative: 1 %
EOS ABS: 0.1 10*3/uL (ref 0–0.7)
HCT: 44.9 % (ref 35.0–47.0)
HEMOGLOBIN: 15.3 g/dL (ref 12.0–16.0)
Lymphs Abs: 1.5 10*3/uL (ref 1.0–3.6)
MCH: 29.8 pg (ref 26.0–34.0)
MCHC: 34 g/dL (ref 32.0–36.0)
MCV: 87.9 fL (ref 80.0–100.0)
Monocytes Absolute: 0.9 10*3/uL (ref 0.2–0.9)
Monocytes Relative: 5 %
Neutro Abs: 15.3 10*3/uL — ABNORMAL HIGH (ref 1.4–6.5)
PLATELETS: 424 10*3/uL (ref 150–440)
RBC: 5.11 MIL/uL (ref 3.80–5.20)
RDW: 14.3 % (ref 11.5–14.5)
WBC: 17.8 10*3/uL — AB (ref 3.6–11.0)

## 2015-07-14 MED ORDER — MORPHINE SULFATE (PF) 4 MG/ML IV SOLN
4.0000 mg | Freq: Once | INTRAVENOUS | Status: AC
  Administered 2015-07-14: 4 mg via INTRAVENOUS

## 2015-07-14 MED ORDER — HYDROMORPHONE HCL 1 MG/ML IJ SOLN
INTRAMUSCULAR | Status: AC
  Filled 2015-07-14: qty 1

## 2015-07-14 MED ORDER — ONDANSETRON HCL 4 MG/2ML IJ SOLN
INTRAMUSCULAR | Status: AC
  Administered 2015-07-14: 4 mg via INTRAVENOUS
  Filled 2015-07-14: qty 2

## 2015-07-14 MED ORDER — ONDANSETRON 4 MG PO TBDP
4.0000 mg | ORAL_TABLET | Freq: Once | ORAL | Status: AC
  Administered 2015-07-14: 4 mg via ORAL
  Filled 2015-07-14: qty 1

## 2015-07-14 MED ORDER — OXYCODONE-ACETAMINOPHEN 5-325 MG PO TABS
2.0000 | ORAL_TABLET | Freq: Once | ORAL | Status: AC
  Administered 2015-07-14: 2 via ORAL
  Filled 2015-07-14: qty 2

## 2015-07-14 MED ORDER — MORPHINE SULFATE (PF) 4 MG/ML IV SOLN
INTRAVENOUS | Status: AC
  Administered 2015-07-14: 4 mg via INTRAVENOUS
  Filled 2015-07-14: qty 1

## 2015-07-14 MED ORDER — ONDANSETRON HCL 4 MG/2ML IJ SOLN
INTRAMUSCULAR | Status: AC
  Filled 2015-07-14: qty 2

## 2015-07-14 MED ORDER — ONDANSETRON 4 MG PO TBDP: 4.0000 mg | ORAL_TABLET | Freq: Three times a day (TID) | ORAL | Status: DC | PRN

## 2015-07-14 MED ORDER — OXYCODONE-ACETAMINOPHEN 5-325 MG PO TABS: 1.0000 | ORAL_TABLET | ORAL | Status: AC | PRN

## 2015-07-14 MED ORDER — ONDANSETRON HCL 4 MG/2ML IJ SOLN
4.0000 mg | Freq: Once | INTRAMUSCULAR | Status: AC
  Administered 2015-07-14: 4 mg via INTRAVENOUS

## 2015-07-14 NOTE — Discharge Instructions (Signed)
1. You may take pain and nausea medicines as needed (Percocet/Zofran #20). 2. Return to the ER for worsening symptoms, persistent vomiting, difficulty breathing or other concerns.  Flank Pain Flank pain refers to pain that is located on the side of the body between the upper abdomen and the back. The pain may occur over a short period of time (acute) or may be long-term or reoccurring (chronic). It may be mild or severe. Flank pain can be caused by many things. CAUSES  Some of the more common causes of flank pain include:  Muscle strains.   Muscle spasms.   A disease of your spine (vertebral disk disease).   A lung infection (pneumonia).   Fluid around your lungs (pulmonary edema).   A kidney infection.   Kidney stones.   A very painful skin rash caused by the chickenpox virus (shingles).   Gallbladder disease.  HOME CARE INSTRUCTIONS  Home care will depend on the cause of your pain. In general,  Rest as directed by your caregiver.  Drink enough fluids to keep your urine clear or pale yellow.  Only take over-the-counter or prescription medicines as directed by your caregiver. Some medicines may help relieve the pain.  Tell your caregiver about any changes in your pain.  Follow up with your caregiver as directed. SEEK IMMEDIATE MEDICAL CARE IF:   Your pain is not controlled with medicine.   You have new or worsening symptoms.  Your pain increases.   You have abdominal pain.   You have shortness of breath.   You have persistent nausea or vomiting.   You have swelling in your abdomen.   You feel faint or pass out.   You have blood in your urine.  You have a fever or persistent symptoms for more than 2-3 days.  You have a fever and your symptoms suddenly get worse. MAKE SURE YOU:   Understand these instructions.  Will watch your condition.  Will get help right away if you are not doing well or get worse.   This information is not  intended to replace advice given to you by your health care provider. Make sure you discuss any questions you have with your health care provider.   Document Released: 04/05/2005 Document Revised: 11/07/2011 Document Reviewed: 09/27/2011 Elsevier Interactive Patient Education 2016 [employer].  Renal Mass A renal mass is a growth in the kidney. Some masses are harmful and may cause cancer. Others are harmless. A renal mass may be solid or filled with fluid. Those that are filled with fluid are called cysts. CAUSES Usually, the cause of a renal mass is unknown. However, certain types of cancers and infections can cause a renal mass.  SIGNS AND SYMPTOMS Symptoms may include:  Blood in the urine.  Pain in the side or back (flank pain).  Feeling full soon after eating.  Weight loss.  Swelling in the abdomen. Some renal masses do not cause symptoms. DIAGNOSIS A renal mass may be found with a CT scan, ultrasound, or MRI of your abdomen.  TREATMENT Treatment will depend on the type of renal mass.  If the renal mass is a cyst that is not causing problems, you will not need treatment.  If the renal mass is a cyst that is causing problems, it may need to be drained during a type of surgery called laparoscopic surgery.  If the renal mass is solid, it may need to be removed with a surgery to your abdomen.  If the renal mass  is caused by kidney cancer, you may need surgery to remove all or part of your kidney. You may need to see your health care provider once or twice a year to have CT scans and ultrasounds done. Having these tests will allow your health care provider to see if your renal mass has changed or gotten bigger. HOME CARE INSTRUCTIONS What you need to do at home will depend on the type of renal mass that you have. The treatment you had also will make a difference. Follow the instructions your health care provider gives you. In general:  Keep all follow-up visits as directed by  your health care provider.  Take medicines only as directed by your health care provider. SEEK MEDICAL CARE IF:  You have abdominal pain.  You have flank pain.  You have a fever. SEEK IMMEDIATE MEDICAL CARE IF:   Your pain gets worse.  There is blood in your urine.  You cannot urinate.  You have chest pain.  You have trouble breathing. MAKE SURE YOU:  Understand these instructions.  Will watch your condition.  Will get help right away if you are not doing well or get worse.   This information is not intended to replace advice given to you by your health care provider. Make sure you discuss any questions you have with your health care provider.   Document Released: 09/09/2013 Document Reviewed: 09/09/2013 Elsevier Interactive Patient Education [employer] Inc.

## 2015-07-14 NOTE — ED Notes (Signed)
Pt returned from US

## 2015-11-07 ENCOUNTER — Emergency Department: Payer: BLUE CROSS/BLUE SHIELD

## 2015-11-07 ENCOUNTER — Emergency Department
Admission: EM | Admit: 2015-11-07 | Discharge: 2015-11-07 | Disposition: A | Discharge status: Home / Self Care | Payer: BLUE CROSS/BLUE SHIELD | Attending: Emergency Medicine | Admitting: Emergency Medicine

## 2015-11-07 DIAGNOSIS — Y9389 Activity, other specified: Secondary | ICD-10-CM | POA: Insufficient documentation

## 2015-11-07 DIAGNOSIS — X58XXXA Exposure to other specified factors, initial encounter: Secondary | ICD-10-CM | POA: Insufficient documentation

## 2015-11-07 DIAGNOSIS — Z794 Long term (current) use of insulin: Secondary | ICD-10-CM | POA: Insufficient documentation

## 2015-11-07 DIAGNOSIS — S4991XA Unspecified injury of right shoulder and upper arm, initial encounter: Secondary | ICD-10-CM | POA: Insufficient documentation

## 2015-11-07 DIAGNOSIS — Z5321 Procedure and treatment not carried out due to patient leaving prior to being seen by health care provider: Secondary | ICD-10-CM | POA: Insufficient documentation

## 2015-11-07 DIAGNOSIS — F17210 Nicotine dependence, cigarettes, uncomplicated: Secondary | ICD-10-CM | POA: Insufficient documentation

## 2015-11-07 DIAGNOSIS — Y929 Unspecified place or not applicable: Secondary | ICD-10-CM | POA: Insufficient documentation

## 2015-11-07 DIAGNOSIS — Z79899 Other long term (current) drug therapy: Secondary | ICD-10-CM | POA: Insufficient documentation

## 2015-11-07 DIAGNOSIS — Y990 Civilian activity done for income or pay: Secondary | ICD-10-CM | POA: Insufficient documentation

## 2015-11-07 DIAGNOSIS — E119 Type 2 diabetes mellitus without complications: Secondary | ICD-10-CM | POA: Insufficient documentation

## 2015-11-07 NOTE — ED Notes (Signed)
This RN spoke with the supervisor of [employer], Carrie Graham, who stated she was unaware that the worker's comp paperwork stated the location of where the patient should be evaluated, states [facility], pt informed

## 2015-11-07 NOTE — ED Triage Notes (Signed)
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

## 2015-12-19 ENCOUNTER — Emergency Department
Admission: EM | Admit: 2015-12-19 | Discharge: 2015-12-19 | Disposition: A | Discharge status: Home / Self Care | Payer: No Typology Code available for payment source | Attending: Emergency Medicine | Admitting: Emergency Medicine

## 2015-12-19 ENCOUNTER — Emergency Department: Payer: No Typology Code available for payment source

## 2015-12-19 DIAGNOSIS — W500XXA Accidental hit or strike by another person, initial encounter: Secondary | ICD-10-CM | POA: Insufficient documentation

## 2015-12-19 DIAGNOSIS — Y9389 Activity, other specified: Secondary | ICD-10-CM | POA: Insufficient documentation

## 2015-12-19 DIAGNOSIS — E119 Type 2 diabetes mellitus without complications: Secondary | ICD-10-CM | POA: Insufficient documentation

## 2015-12-19 DIAGNOSIS — S0231XA Fracture of orbital floor, right side, initial encounter for closed fracture: Secondary | ICD-10-CM | POA: Insufficient documentation

## 2015-12-19 DIAGNOSIS — Y929 Unspecified place or not applicable: Secondary | ICD-10-CM | POA: Insufficient documentation

## 2015-12-19 DIAGNOSIS — S0591XA Unspecified injury of right eye and orbit, initial encounter: Secondary | ICD-10-CM | POA: Diagnosis present

## 2015-12-19 DIAGNOSIS — S02839A Fracture of medial orbital wall, unspecified side, initial encounter for closed fracture: Secondary | ICD-10-CM

## 2015-12-19 DIAGNOSIS — F17210 Nicotine dependence, cigarettes, uncomplicated: Secondary | ICD-10-CM | POA: Diagnosis not present

## 2015-12-19 DIAGNOSIS — Z794 Long term (current) use of insulin: Secondary | ICD-10-CM | POA: Diagnosis not present

## 2015-12-19 DIAGNOSIS — Y999 Unspecified external cause status: Secondary | ICD-10-CM | POA: Insufficient documentation

## 2015-12-19 DIAGNOSIS — Z79899 Other long term (current) drug therapy: Secondary | ICD-10-CM | POA: Insufficient documentation

## 2015-12-19 DIAGNOSIS — S0230XA Fracture of orbital floor, unspecified side, initial encounter for closed fracture: Secondary | ICD-10-CM

## 2015-12-19 DIAGNOSIS — S0501XA Injury of conjunctiva and corneal abrasion without foreign body, right eye, initial encounter: Secondary | ICD-10-CM

## 2015-12-19 MED ORDER — ONDANSETRON 8 MG PO TBDP
8.0000 mg | ORAL_TABLET | Freq: Once | ORAL | Status: AC
  Administered 2015-12-19: 8 mg via ORAL
  Filled 2015-12-19: qty 1

## 2015-12-19 MED ORDER — MORPHINE SULFATE (PF) 4 MG/ML IV SOLN: 4.0000 mg | Freq: Once | INTRAVENOUS | Status: DC

## 2015-12-19 MED ORDER — AMOXICILLIN-POT CLAVULANATE 875-125 MG PO TABS
1.0000 | ORAL_TABLET | Freq: Once | ORAL | Status: AC
  Administered 2015-12-19: 1 via ORAL
  Filled 2015-12-19: qty 1

## 2015-12-19 MED ORDER — MORPHINE SULFATE (PF) 4 MG/ML IV SOLN
4.0000 mg | Freq: Once | INTRAVENOUS | Status: AC
  Administered 2015-12-19: 4 mg via INTRAMUSCULAR
  Filled 2015-12-19: qty 1

## 2015-12-19 MED ORDER — AMOXICILLIN-POT CLAVULANATE 875-125 MG PO TABS: 1.0000 | ORAL_TABLET | Freq: Two times a day (BID) | ORAL | 0 refills | Status: AC

## 2015-12-19 MED ORDER — HYDROCODONE-ACETAMINOPHEN 5-325 MG PO TABS
1.0000 | ORAL_TABLET | Freq: Once | ORAL | Status: AC
  Administered 2015-12-19: 1 via ORAL
  Filled 2015-12-19: qty 1

## 2015-12-19 MED ORDER — HYDROCODONE-ACETAMINOPHEN 5-325 MG PO TABS: 1.0000 | ORAL_TABLET | ORAL | 0 refills | Status: DC | PRN

## 2015-12-19 MED ORDER — FLUORESCEIN SODIUM 1 MG OP STRP
1.0000 | ORAL_STRIP | Freq: Once | OPHTHALMIC | Status: AC
  Administered 2015-12-19: 1 via OPHTHALMIC
  Filled 2015-12-19: qty 1

## 2015-12-19 MED ORDER — POLYMYXIN B-TRIMETHOPRIM 10000-0.1 UNIT/ML-% OP SOLN: 2.0000 [drp] | Freq: Four times a day (QID) | OPHTHALMIC | 0 refills | Status: AC

## 2015-12-19 MED ORDER — ONDANSETRON 4 MG PO TBDP: 4.0000 mg | ORAL_TABLET | Freq: Three times a day (TID) | ORAL | 0 refills | Status: DC | PRN

## 2015-12-19 MED ORDER — TETRACAINE HCL 0.5 % OP SOLN
2.0000 [drp] | Freq: Once | OPHTHALMIC | Status: AC
  Administered 2015-12-19: 2 [drp] via OPHTHALMIC
  Filled 2015-12-19: qty 2

## 2015-12-19 NOTE — ED Notes (Signed)

## 2015-12-19 NOTE — ED Notes (Signed)
Pt reports normally wears glasses; visual acuity left eye 20/70 and right eye 20/200 with blurring
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
## 2016-05-07 ENCOUNTER — Emergency Department
Admission: EM | Admit: 2016-05-07 | Discharge: 2016-05-08 | Disposition: A | Discharge status: Home / Self Care | Payer: PRIVATE HEALTH INSURANCE | Attending: Emergency Medicine | Admitting: Emergency Medicine

## 2016-05-07 ENCOUNTER — Encounter: Payer: Self-pay | Admitting: Emergency Medicine

## 2016-05-07 ENCOUNTER — Emergency Department: Payer: PRIVATE HEALTH INSURANCE

## 2016-05-07 DIAGNOSIS — Z794 Long term (current) use of insulin: Secondary | ICD-10-CM | POA: Insufficient documentation

## 2016-05-07 DIAGNOSIS — R079 Chest pain, unspecified: Secondary | ICD-10-CM

## 2016-05-07 DIAGNOSIS — F17210 Nicotine dependence, cigarettes, uncomplicated: Secondary | ICD-10-CM | POA: Insufficient documentation

## 2016-05-07 DIAGNOSIS — E119 Type 2 diabetes mellitus without complications: Secondary | ICD-10-CM | POA: Insufficient documentation

## 2016-05-07 LAB — COMPREHENSIVE METABOLIC PANEL
ALK PHOS: 112 U/L (ref 38–126)
ALT: 22 U/L (ref 14–54)
AST: 39 U/L (ref 15–41)
Albumin: 3.8 g/dL (ref 3.5–5.0)
Anion gap: 9 (ref 5–15)
BUN: 11 mg/dL (ref 6–20)
CALCIUM: 8.8 mg/dL — AB (ref 8.9–10.3)
CO2: 24 mmol/L (ref 22–32)
CREATININE: 0.63 mg/dL (ref 0.44–1.00)
Chloride: 101 mmol/L (ref 101–111)
Glucose, Bld: 417 mg/dL — ABNORMAL HIGH (ref 65–99)
Potassium: 3.3 mmol/L — ABNORMAL LOW (ref 3.5–5.1)
Sodium: 134 mmol/L — ABNORMAL LOW (ref 135–145)
Total Bilirubin: 0.2 mg/dL — ABNORMAL LOW (ref 0.3–1.2)
Total Protein: 7.5 g/dL (ref 6.5–8.1)

## 2016-05-07 LAB — TROPONIN I

## 2016-05-07 LAB — CBC WITH DIFFERENTIAL/PLATELET
BASOS ABS: 0.1 10*3/uL (ref 0–0.1)
Basophils Relative: 1 %
EOS ABS: 0.5 10*3/uL (ref 0–0.7)
EOS PCT: 4 %
HCT: 39.3 % (ref 35.0–47.0)
Hemoglobin: 13.9 g/dL (ref 12.0–16.0)
Lymphocytes Relative: 30 %
Lymphs Abs: 3.6 10*3/uL (ref 1.0–3.6)
MCH: 30.6 pg (ref 26.0–34.0)
MCHC: 35.4 g/dL (ref 32.0–36.0)
MCV: 86.4 fL (ref 80.0–100.0)
Monocytes Absolute: 0.9 10*3/uL (ref 0.2–0.9)
Monocytes Relative: 7 %
Neutro Abs: 7 10*3/uL — ABNORMAL HIGH (ref 1.4–6.5)
Neutrophils Relative %: 58 %
PLATELETS: 381 10*3/uL (ref 150–440)
RBC: 4.55 MIL/uL (ref 3.80–5.20)
RDW: 13.7 % (ref 11.5–14.5)
WBC: 12.1 10*3/uL — AB (ref 3.6–11.0)

## 2016-05-07 LAB — FIBRIN DERIVATIVES D-DIMER (ARMC ONLY): FIBRIN DERIVATIVES D-DIMER (ARMC): 533.78 — AB (ref 0.00–499.00)

## 2016-05-07 MED ORDER — MORPHINE SULFATE (PF) 2 MG/ML IV SOLN
2.0000 mg | Freq: Once | INTRAVENOUS | Status: AC
  Administered 2016-05-07: 2 mg via INTRAVENOUS

## 2016-05-07 MED ORDER — SODIUM CHLORIDE 0.9 % IV SOLN
Freq: Once | INTRAVENOUS | Status: AC
  Administered 2016-05-07: via INTRAVENOUS

## 2016-05-07 MED ORDER — MORPHINE SULFATE (PF) 2 MG/ML IV SOLN
INTRAVENOUS | Status: AC
  Filled 2016-05-07: qty 1

## 2016-05-07 MED ORDER — MORPHINE SULFATE (PF) 2 MG/ML IV SOLN
INTRAVENOUS | Status: AC
  Administered 2016-05-07: 2 mg via INTRAVENOUS
  Filled 2016-05-07: qty 1

## 2016-05-07 MED ORDER — GI COCKTAIL ~~LOC~~
30.0000 mL | Freq: Once | ORAL | Status: AC
  Administered 2016-05-07: 30 mL via ORAL
  Filled 2016-05-07: qty 30

## 2016-05-07 NOTE — ED Triage Notes (Signed)
Patient to ER for c/o chest pain (under right collar bone, midsternal, and under left breast) that started on Saturday, worsened today. Patient also reports N/V (approx 8 times total) since Saturday, diarrhea, and dizzy episodes. States she has had diaphoresis, but has that frequently prior to this.

## 2016-05-07 NOTE — ED Notes (Signed)
Patient transported to X-ray 

## 2016-05-07 NOTE — ED Notes (Signed)
Report off to kasey rn  

## 2016-05-07 NOTE — ED Provider Notes (Signed)
[facility] Emergency Department Provider Note   ____________________________________________   First MD Initiated Contact with Patient 05/07/16 2106     (approximate)  I have reviewed the triage vital signs and the nursing notes.   HISTORY  Chief Complaint Chest Pain    HPI Carrie MinersBenita Graham is a 46 y.o. female who reports onset of chest pain. It started Saturday got worse today as been very bad for the last hour and a half. The pain gets bad it feels better if she holds her breath but otherwise she is not short of breath. She is not sweaty. She is not running a fever. The pain actually gets better if she moves around or walks up the stairs. The pain starts in the left upper abdomen and radiates through the middle of the chest up to the right shoulder. His reproduced by palpation of the center of the chest the right anterior upper chest and below the right scapula. She had nausea vomiting and diarrhea for several days before the pain began.patient has diabetes and smokes.   Past Medical History:  Diagnosis Date  . Anemia   . Bleeding disorder (HCC)   . Diabetes mellitus without complication (HCC) 2014  . GERD (gastroesophageal reflux disease)   . IBS (irritable bowel syndrome)   . Insomnia   . Kidney stones   . Ulcer (HCC)     There are no active problems to display for this patient.   Past Surgical History:  Procedure Laterality Date  . ABDOMINAL HYSTERECTOMY  2014  . ABDOMINOPLASTY  2000  . CESAREAN SECTION  [id number],1992  . CHOLECYSTECTOMY  1994  . HERNIA REPAIR  03-03-13   umbilical hernia  . LEFT OOPHORECTOMY  2014  . SHOULDER SURGERY      Prior to Admission medications   Medication Sig Start Date End Date Taking? Authorizing Provider  ALPRAZolam Prudy Feeler(XANAX) 0.5 MG tablet Take 0.5 mg by mouth as needed for anxiety.    Historical Provider, MD  amoxicillin-clavulanate (AUGMENTIN) 875-125 MG tablet Take 1 tablet by mouth 2 (two) times daily.  12/19/15   Delorise RoyalsJonathan D Cuthriell, PA-C  doxycycline (VIBRA-TABS) 100 MG tablet Take 1 tablet (100 mg total) by mouth 2 (two) times daily. 07/02/14   III William C Ruffian, PA-C  escitalopram (LEXAPRO) 10 MG tablet Take 10 mg by mouth daily.    Historical Provider, MD  HYDROcodone-acetaminophen (NORCO/VICODIN) 5-325 MG tablet Take 1 tablet by mouth every 4 (four) hours as needed for moderate pain. 12/19/15   Delorise RoyalsJonathan D Cuthriell, PA-C  Insulin Aspart (NOVOLOG [place]) Inject into the skin 3 (three) times daily.    Historical Provider, MD  insulin aspart (NOVOLOG) 100 UNIT/ML injection Per sliding scale 05/10/15 07/10/15  Darci Currentandolph N Brown, MD  Insulin Detemir (LEVEMIR [place]) Inject into the skin 2 (two) times daily.    Historical Provider, MD  insulin detemir (LEVEMIR) 100 UNIT/ML injection Inject 0.14 mLs (14 Units total) into the skin 2 (two) times daily. 05/10/15   Darci Currentandolph N Brown, MD  loratadine (CLARITIN) 10 MG tablet Take 1 tablet (10 mg total) by mouth daily. 07/02/14 07/02/15  III William C Ruffian, PA-C  ondansetron (ZOFRAN-ODT) 4 MG disintegrating tablet Take 1 tablet (4 mg total) by mouth every 8 (eight) hours as needed for nausea or vomiting. 12/19/15   Delorise RoyalsJonathan D Cuthriell, PA-C  oxyCODONE-acetaminophen (ROXICET) 5-325 MG tablet Take 1 tablet by mouth every 4 (four) hours as needed for severe pain. 07/14/15   Irean HongJade J Sung, MD  sitaGLIPtin (JANUVIA) 100 MG tablet Take 100 mg by mouth daily.    Historical Provider, MD  temazepam (RESTORIL) 15 MG capsule Take 15 mg by mouth at bedtime as needed for sleep.    Historical Provider, MD  trimethoprim-polymyxin b (POLYTRIM) ophthalmic solution Place 2 drops into the left eye every 6 (six) hours. 12/19/15   Delorise Royals Cuthriell, PA-C    Allergies Nsaids; Oxycodone; Sulfa antibiotics; and Toradol [ketorolac tromethamine]  Family History  Problem Relation Age of Onset  . Cancer Mother     ovarian    Social History Social History  Substance Use Topics  .  Smoking status: Current Every Day Smoker    Packs/day: 0.50    Types: Cigarettes  . Smokeless tobacco: Never Used  . Alcohol use No    Review of Systems Constitutional: No fever/chills Eyes: No visual changes. ENT: No sore throat. Cardiovascular:see history of present illness Respiratory: he history of present illness Gastrointestinal: No abdominal pain.  No nausea, no vomiting.  No diarrhea.  No constipation. Genitourinary: Negative for dysuria. Musculoskeletal: Negative for back pain. Skin: Negative for rash. Neurological: Negative for headaches, focal weakness or numbness.  10-point ROS otherwise negative.  ____________________________________________   PHYSICAL EXAM:  VITAL SIGNS: ED Triage Vitals  Enc Vitals Group     BP 05/07/16 2105 134/83     Pulse Rate 05/07/16 2105 97     Resp 05/07/16 2105 18     Temp 05/07/16 2105 98.3 F (36.8 C)     Temp Source 05/07/16 2105 Oral     SpO2 05/07/16 2105 98 %     Weight 05/07/16 2102 142 lb 9.6 oz (64.7 kg)     Height 05/07/16 2102 5\' 7"  (1.702 m)     Head Circumference --      Peak Flow --      Pain Score 05/07/16 2102 10     Pain Loc --      Pain Edu? --      Excl. in GC? --     Constitutional: Alert and oriented. Well appearing and in no acute distress. Eyes: Conjunctivae are normal. PERRL. EOMI. Head: Atraumatic. Nose: No congestion/rhinnorhea. Mouth/Throat: Mucous membranes are moist.  Oropharynx non-erythematous. Neck: No stridor.  Cardiovascular: Normal rate, regular rhythm. Grossly normal heart sounds.  Good peripheral circulation. Respiratory: Normal respiratory effort.  No retractions. Lungs CTAB.chest is tender as described in history of present illness Gastrointestinal: Soft and nontender except for some mild tenderness in the epigastric area. No distention. No abdominal bruits. No CVA tenderness. Musculoskeletal: No lower extremity tenderness nor edema.  No joint effusions. Neurologic:  Normal speech  and language. No gross focal neurologic deficits are appreciated. No gait instability. Skin:  Skin is warm, dry and intact. No rash noted. Psychiatric: Mood and affect are normal. Speech and behavior are normal.  ____________________________________________   LABS (all labs ordered are listed, but only abnormal results are displayed)  Labs Reviewed  COMPREHENSIVE METABOLIC PANEL - Abnormal; Notable for the following:       Result Value   Sodium 134 (*)    Potassium 3.3 (*)    Glucose, Bld 417 (*)    Calcium 8.8 (*)    Total Bilirubin 0.2 (*)    All other components within normal limits  CBC WITH DIFFERENTIAL/PLATELET - Abnormal; Notable for the following:    WBC 12.1 (*)    Neutro Abs 7.0 (*)    All other components within normal limits  FIBRIN DERIVATIVES D-DIMER (  [facility] ONLY) - Abnormal; Notable for the following:    Fibrin derivatives D-dimer [facility]) 533.78 (*)    All other components within normal limits  TROPONIN I  PREGNANCY, URINE  TROPONIN I   ____________________________________________  EKG  EKG read and interpreted by me shows normal sinus rhythm rate of 97 normal axis there is some flattening of T waves in the lateral chest leads V4 through 6 but otherwise no obvious acute findings. The baseline is not very clear. ____________________________________________  RADIOLOGY  Study Result   CLINICAL DATA:  46 year old female with chest pain.  EXAM: CHEST  2 VIEW  COMPARISON:  Chest radiograph dated 05/10/2015  FINDINGS: The lungs are clear. There is no pleural effusion or pneumothorax. The cardiac silhouette is within normal limits. No acute osseous pathology. Right upper quadrant cholecystectomy clips.  IMPRESSION: No active cardiopulmonary disease.   Electronically Signed   By: Elgie Collard M.D.   On: 05/07/2016 21:27    ____________________________________________   PROCEDURES  Procedure(s) performed:  Procedures  Critical Care  performed:  ____________________________________________   INITIAL IMPRESSION / ASSESSMENT AND PLAN / ED COURSE  Pertinent labs & imaging results that were available during my care of the patient were reviewed by me and considered in my medical decision making (see chart for details).  Patient's d-dimer is somewhat elevated I will do a CT angioma and give her some fluids for her high blood sugar. Dr. Zenda Alpers will follow-up the second troponin and the CAT scan.      ____________________________________________   FINAL CLINICAL IMPRESSION(S) / ED DIAGNOSES  Final diagnoses:  Chest pain, unspecified type      NEW MEDICATIONS STARTED DURING THIS VISIT:  New Prescriptions   No medications on file     Note:  This document was prepared using Dragon voice recognition software and may include unintentional dictation errors.    Arnaldo Natal, MD 05/07/16 [phone]

## 2016-05-07 NOTE — ED Notes (Signed)
Pt reports pain is returning, MD notified, see MAR.

## 2016-05-07 NOTE — ED Notes (Signed)
Pt reports left upper abd pain beneath left breast and burning type pain in center of chest.  Pt states she vomiting and diarrhea during past 2 days, none today.  Pt tried otc meds without relief for reflux.  Pt alert.

## 2016-05-08 ENCOUNTER — Encounter: Payer: Self-pay | Admitting: Radiology

## 2016-05-08 ENCOUNTER — Emergency Department: Payer: PRIVATE HEALTH INSURANCE

## 2016-05-08 LAB — GLUCOSE, CAPILLARY: Glucose-Capillary: 346 mg/dL — ABNORMAL HIGH (ref 65–99)

## 2016-05-08 LAB — LIPASE, BLOOD: Lipase: 38 U/L (ref 11–51)

## 2016-05-08 LAB — TROPONIN I: Troponin I: <0.03 ng/mL (ref <0.03)

## 2016-05-08 LAB — POCT PREGNANCY, URINE: PREG TEST UR: NEGATIVE

## 2016-05-08 MED ORDER — TRAMADOL HCL 50 MG PO TABS
50.0000 mg | ORAL_TABLET | Freq: Once | ORAL | Status: AC
  Administered 2016-05-08: 50 mg via ORAL
  Filled 2016-05-08: qty 1

## 2016-05-08 MED ORDER — IOPAMIDOL (ISOVUE-370) INJECTION 76%
75.0000 mL | Freq: Once | INTRAVENOUS | Status: AC | PRN
  Administered 2016-05-08: 75 mL via INTRAVENOUS

## 2016-05-08 NOTE — ED Notes (Signed)
Pt currently laying on side asleep. In NAD at this time.

## 2016-05-08 NOTE — ED Provider Notes (Signed)
-----------------------------------------   3:00 AM on 05/08/2016 -----------------------------------------   Blood pressure (!) 94/55, pulse 62, temperature 98.3 F (36.8 C), temperature source Oral, resp. rate 17, height 5\' 7"  (1.702 m), weight 142 lb 9.6 oz (64.7 kg), SpO2 95 %.  Assuming care from Dr. Darnelle CatalanMalinda.  In short, Carrie Graham is a 46 y.o. female with a chief complaint of Chest Pain .  Refer to the original H&P for additional details.  The current plan of care is to follow up the result of the CT scan and disposition the patient   Clinical Course as of May 08 298  Tue May 08, 2016  0138 No acute intrathoracic pathology. No CT evidence of pulmonary embolism.   CT Angio Chest PE W and/or Wo Contrast [AW]    Clinical Course User Index [AW] Carrie ApleyAllison P Sharlot Sturkey, MD   The patient's CT scan does not show a PE. The patient did complain of some left upper quadrant pain and stated that she did have a history of pancreatitis so we added a lipase onto her blood work which was unremarkable. The patient did receive some tramadol for pain. She'll be discharged home to follow-up. The patient has no further complaints or concerns at this time.   Carrie ApleyAllison P Jess Sulak, MD 05/08/16 [phone]

## 2016-05-08 NOTE — Discharge Instructions (Signed)
Please follow up with the acute care clinic °

## 2016-05-08 NOTE — ED Notes (Signed)
Lab called about added on lipase. Lab states they are able to add it to blood already sent down.

## 2016-05-08 NOTE — ED Notes (Addendum)
Pt reports left side pain, pointing to site under left breast. Also reports headache. Pt states hx of pancreatitis, MD notified.

## 2016-05-08 NOTE — ED Notes (Addendum)
Pt reports "total hysterectomy, no uterus just my right ovary." CT notified

## 2016-05-08 NOTE — ED Notes (Signed)

## 2016-05-08 NOTE — ED Notes (Signed)
Patient transported to CT 

## 2016-07-02 ENCOUNTER — Emergency Department
Admission: EM | Admit: 2016-07-02 | Discharge: 2016-07-02 | Disposition: A | Discharge status: Home / Self Care | Payer: No Typology Code available for payment source | Attending: Emergency Medicine | Admitting: Emergency Medicine

## 2016-07-02 ENCOUNTER — Emergency Department: Payer: No Typology Code available for payment source

## 2016-07-02 ENCOUNTER — Encounter: Payer: Self-pay | Admitting: Emergency Medicine

## 2016-07-02 DIAGNOSIS — F17210 Nicotine dependence, cigarettes, uncomplicated: Secondary | ICD-10-CM | POA: Insufficient documentation

## 2016-07-02 DIAGNOSIS — S199XXA Unspecified injury of neck, initial encounter: Secondary | ICD-10-CM | POA: Diagnosis present

## 2016-07-02 DIAGNOSIS — E119 Type 2 diabetes mellitus without complications: Secondary | ICD-10-CM | POA: Insufficient documentation

## 2016-07-02 DIAGNOSIS — S63501A Unspecified sprain of right wrist, initial encounter: Secondary | ICD-10-CM | POA: Insufficient documentation

## 2016-07-02 DIAGNOSIS — M62838 Other muscle spasm: Secondary | ICD-10-CM

## 2016-07-02 DIAGNOSIS — Z79899 Other long term (current) drug therapy: Secondary | ICD-10-CM | POA: Insufficient documentation

## 2016-07-02 DIAGNOSIS — Y9389 Activity, other specified: Secondary | ICD-10-CM | POA: Diagnosis not present

## 2016-07-02 DIAGNOSIS — Y92410 Unspecified street and highway as the place of occurrence of the external cause: Secondary | ICD-10-CM | POA: Diagnosis not present

## 2016-07-02 DIAGNOSIS — Z794 Long term (current) use of insulin: Secondary | ICD-10-CM | POA: Insufficient documentation

## 2016-07-02 DIAGNOSIS — Y999 Unspecified external cause status: Secondary | ICD-10-CM | POA: Insufficient documentation

## 2016-07-02 DIAGNOSIS — S161XXA Strain of muscle, fascia and tendon at neck level, initial encounter: Secondary | ICD-10-CM | POA: Insufficient documentation

## 2016-07-02 MED ORDER — CYCLOBENZAPRINE HCL 5 MG PO TABS: 5.0000 mg | ORAL_TABLET | Freq: Three times a day (TID) | ORAL | 0 refills | Status: DC | PRN
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
## 2016-07-02 NOTE — Discharge Instructions (Signed)
Take medication as prescribed. Return to emergency department if symptoms worsen and follow-up with PCP as needed.   °
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
## 2017-04-23 ENCOUNTER — Encounter: Payer: Self-pay | Admitting: Intensive Care

## 2017-04-23 ENCOUNTER — Emergency Department: Payer: PRIVATE HEALTH INSURANCE

## 2017-04-23 ENCOUNTER — Emergency Department
Admission: EM | Admit: 2017-04-23 | Discharge: 2017-04-23 | Disposition: A | Discharge status: Home / Self Care | Payer: PRIVATE HEALTH INSURANCE | Attending: Emergency Medicine | Admitting: Emergency Medicine

## 2017-04-23 DIAGNOSIS — Z79899 Other long term (current) drug therapy: Secondary | ICD-10-CM | POA: Insufficient documentation

## 2017-04-23 DIAGNOSIS — F17210 Nicotine dependence, cigarettes, uncomplicated: Secondary | ICD-10-CM | POA: Insufficient documentation

## 2017-04-23 DIAGNOSIS — J111 Influenza due to unidentified influenza virus with other respiratory manifestations: Secondary | ICD-10-CM

## 2017-04-23 DIAGNOSIS — E119 Type 2 diabetes mellitus without complications: Secondary | ICD-10-CM | POA: Insufficient documentation

## 2017-04-23 DIAGNOSIS — Z794 Long term (current) use of insulin: Secondary | ICD-10-CM | POA: Insufficient documentation

## 2017-04-23 LAB — INFLUENZA PANEL BY PCR (TYPE A & B)
INFLBPCR: NEGATIVE
Influenza A By PCR: POSITIVE — AB

## 2017-04-23 MED ORDER — PSEUDOEPH-BROMPHEN-DM 30-2-10 MG/5ML PO SYRP: 10.0000 mL | ORAL_SOLUTION | Freq: Four times a day (QID) | ORAL | 0 refills | Status: AC | PRN

## 2017-04-23 MED ORDER — ONDANSETRON 4 MG PO TBDP: 4.0000 mg | ORAL_TABLET | Freq: Three times a day (TID) | ORAL | 0 refills | Status: DC | PRN

## 2017-04-23 MED ORDER — FLUTICASONE PROPIONATE 50 MCG/ACT NA SUSP: 1.0000 | Freq: Two times a day (BID) | NASAL | 0 refills | Status: AC

## 2017-04-23 NOTE — ED Triage Notes (Signed)
Pt c/o Cough, congestion, body aches, and fevers since Friday. Last took tylenol around 4pm.

## 2017-04-23 NOTE — ED Provider Notes (Signed)
[facility] Emergency Department Provider Note  ____________________________________________  Time seen: Approximately 7:30 PM  I have reviewed the triage vital signs and the nursing notes.   HISTORY  Chief Complaint Influenza    HPI Carrie Graham is a 47 y.o. female who presents emergency department complaining of 4 days of nasal congestion, sore throat, fevers and chills, coughing, nausea, vomiting, body aches.  All symptoms began rather rapidly.  Patient is tried multiple over-the-counter cold, cough, flu medication as well as Tylenol Motrin and fluids.  Patient reports that none of the over-the-counter medication is improving her symptoms.  She denies any headache, neck pain or stiffness, chest pain, shortness of breath, abdominal pain, diarrhea or constipation.  No other complaints at this time.  Patient has a history of anemia, diabetes, GERD, IBS.  Patient denies any complaints with chronic medical problems at this time.  Past Medical History:  Diagnosis Date  . Anemia   . Bleeding disorder (HCC)   . Diabetes mellitus without complication (HCC) 2014  . GERD (gastroesophageal reflux disease)   . IBS (irritable bowel syndrome)   . Insomnia   . Kidney stones   . Ulcer     There are no active problems to display for this patient.   Past Surgical History:  Procedure Laterality Date  . ABDOMINAL HYSTERECTOMY  2014  . ABDOMINOPLASTY  2000  . CESAREAN SECTION  [id number],1992  . CHOLECYSTECTOMY  1994  . HERNIA REPAIR  03-03-13   umbilical hernia  . LEFT OOPHORECTOMY  2014  . SHOULDER SURGERY      Prior to Admission medications   Medication Sig Start Date End Date Taking? Authorizing Provider  ALPRAZolam Prudy Feeler(XANAX) 0.5 MG tablet Take 0.5 mg by mouth as needed for anxiety.    [provider]  amoxicillin-clavulanate (AUGMENTIN) 875-125 MG tablet Take 1 tablet by mouth 2 (two) times daily. 12/19/15   Stepehn Eckard, Delorise RoyalsJonathan D, PA-C   brompheniramine-pseudoephedrine-DM 30-2-10 MG/5ML syrup Take 10 mLs by mouth 4 (four) times daily as needed. 04/23/17   Brennah Quraishi, Delorise RoyalsJonathan D, PA-C  cyclobenzaprine (FLEXERIL) 5 MG tablet Take 1 tablet (5 mg total) by mouth 3 (three) times daily as needed for muscle spasms. 07/02/16   Little, Traci M, PA-C  doxycycline (VIBRA-TABS) 100 MG tablet Take 1 tablet (100 mg total) by mouth 2 (two) times daily. 07/02/14   Ruffian, III Kristine GarbeWilliam C, PA-C  escitalopram (LEXAPRO) 10 MG tablet Take 10 mg by mouth daily.    [provider]  fluticasone (FLONASE) 50 MCG/ACT nasal spray Place 1 spray into both nostrils 2 (two) times daily. 04/23/17   Ande Therrell, Delorise RoyalsJonathan D, PA-C  HYDROcodone-acetaminophen (NORCO/VICODIN) 5-325 MG tablet Take 1 tablet by mouth every 4 (four) hours as needed for moderate pain. 12/19/15   Ariona Deschene, Delorise RoyalsJonathan D, PA-C  Insulin Aspart (NOVOLOG [place]) Inject into the skin 3 (three) times daily.    [provider]  insulin aspart (NOVOLOG) 100 UNIT/ML injection Per sliding scale 05/10/15 07/10/15  Darci CurrentBrown, [place] N, MD  Insulin Detemir (LEVEMIR [place]) Inject into the skin 2 (two) times daily.    [provider]  insulin detemir (LEVEMIR) 100 UNIT/ML injection Inject 0.14 mLs (14 Units total) into the skin 2 (two) times daily. 05/10/15   Darci CurrentBrown, [place] N, MD  loratadine (CLARITIN) 10 MG tablet Take 1 tablet (10 mg total) by mouth daily. 07/02/14 07/02/15  Garrel Ridgeluffian, III William C, PA-C  ondansetron (ZOFRAN-ODT) 4 MG disintegrating tablet Take 1 tablet (4 mg total) by mouth every  8 (eight) hours as needed for nausea or vomiting. 04/23/17   Waleed Dettman, Delorise Royals, PA-C  oxyCODONE-acetaminophen (ROXICET) 5-325 MG tablet Take 1 tablet by mouth every 4 (four) hours as needed for severe pain. 07/14/15   Irean Hong, MD  sitaGLIPtin (JANUVIA) 100 MG tablet Take 100 mg by mouth daily.    [provider]  temazepam (RESTORIL) 15 MG capsule Take 15 mg by mouth at bedtime as needed for  sleep.    [provider]  trimethoprim-polymyxin b (POLYTRIM) ophthalmic solution Place 2 drops into the left eye every 6 (six) hours. 12/19/15   Kamorah Nevils, Delorise Royals, PA-C    Allergies Nsaids; Oxycodone; Sulfa antibiotics; and Toradol [ketorolac tromethamine]  Family History  Problem Relation Age of Onset  . Cancer Mother        ovarian    Social History Social History   Tobacco Use  . Smoking status: Current Every Day Smoker    Packs/day: 0.50    Types: Cigarettes  . Smokeless tobacco: Never Used  Substance Use Topics  . Alcohol use: No  . Drug use: No     Review of Systems  Constitutional: Positive fever/chills Eyes: No visual changes. No discharge ENT: Congestion and sore throat Cardiovascular: no chest pain. Respiratory: Positive cough. No SOB. Gastrointestinal: No abdominal pain.  positive for nausea and emesis.  No diarrhea.  No constipation. Musculoskeletal: Negative for musculoskeletal pain. Skin: Negative for rash, abrasions, lacerations, ecchymosis. Neurological: Negative for headaches, focal weakness or numbness. 10-point ROS otherwise negative.  ____________________________________________   PHYSICAL EXAM:  VITAL SIGNS: ED Triage Vitals [04/23/17 1821]  Enc Vitals Group     BP (!) 148/106     Pulse Rate 98     Resp 18     Temp (!) 100.5 F (38.1 C)     Temp Source Oral     SpO2 96 %     Weight 152 lb (68.9 kg)     Height 5' 7.5" (1.715 m)     Head Circumference      Peak Flow      Pain Score 8     Pain Loc      Pain Edu?      Excl. in GC?      Constitutional: Alert and oriented. Well appearing and in no acute distress. Eyes: Conjunctivae are normal. PERRL. EOMI. Head: Atraumatic. ENT:      Ears: EACs and TMs unremarkable bilaterally.      Nose: Moderate to significant congestion/rhinnorhea.      Mouth/Throat: Mucous membranes are moist.  Oropharynx is mildly erythematous but nonedematous.  Postnasal drip identified.   Tonsils are mildly erythematous but nonedematous.  Uvula is midline. Neck: No stridor.  Neck is supple full range of motion Hematological/Lymphatic/Immunilogical: Diffuse, mobile, nontender anterior cervical lymphadenopathy. Cardiovascular: Normal rate, regular rhythm. Normal S1 and S2.  Good peripheral circulation. Respiratory: Normal respiratory effort without tachypnea or retractions. Lungs CTAB. Good air entry to the bases with no decreased or absent breath sounds. Gastrointestinal: Bowel sounds 4 quadrants. Soft and nontender to palpation. No guarding or rigidity. No palpable masses. No distention. No CVA tenderness. Musculoskeletal: Full range of motion to all extremities. No gross deformities appreciated. Neurologic:  Normal speech and language. No gross focal neurologic deficits are appreciated.  Skin:  Skin is warm, dry and intact. No rash noted. Psychiatric: Mood and affect are normal. Speech and behavior are normal. Patient exhibits appropriate insight and judgement.   ____________________________________________   LABS (all  labs ordered are listed, but only abnormal results are displayed)  Labs Reviewed  INFLUENZA PANEL BY PCR (TYPE A & B) - Abnormal; Notable for the following components:      Result Value   Influenza A By PCR POSITIVE (*)    All other components within normal limits   ____________________________________________  EKG   ____________________________________________  RADIOLOGY Festus Barren Efraim Vanallen, personally viewed and evaluated these images (plain radiographs) as part of my medical decision making, as well as reviewing the written report by the radiologist.  I concur with radiologist finding of no acute consolidation consistent with pneumonia.  No evidence of pleural effusion.  Dg Chest 2 View  Result Date: 04/23/2017 CLINICAL DATA:  Cough, congestion and, body aches and fever since 04/19/2017. EXAM: CHEST  2 VIEW COMPARISON:  CT chest 05/08/2016.   PA and lateral chest 05/07/2016. FINDINGS: Lungs clear. Heart size normal. No pneumothorax or pleural fluid. No acute bony abnormality. Cholecystectomy clips noted. IMPRESSION: Negative chest. Electronically Signed   By: Drusilla Kanner M.D.   On: 04/23/2017 19:03    ____________________________________________    PROCEDURES  Procedure(s) performed:    Procedures    Medications - No data to display   ____________________________________________   INITIAL IMPRESSION / ASSESSMENT AND PLAN / ED COURSE  Pertinent labs & imaging results that were available during my care of the patient were reviewed by me and considered in my medical decision making (see chart for details).  Review of the [place] CSRS was performed in accordance of the [facility] prior to dispensing any controlled drugs.     Patient's diagnosis is consistent with influenza A.  Patient presented with sudden onset of multiple flulike symptoms.  Differential included viral URI, sinusitis, strep, bronchitis, pneumonia.  Positive for influenza A on testing.  Chest x-ray was negative.  Exam is reassuring.  Patient is outside treatment window with Tamiflu.  Patient is given prescriptions for Zofran, Flonase, Bromfed cough syrup for symptom control.  Tylenol and Motrin at home.  Plenty of fluids.  Patient is to follow-up with primary care as needed..  Patient is given ED precautions to return to the ED for any worsening or new symptoms.     ____________________________________________  FINAL CLINICAL IMPRESSION(S) / ED DIAGNOSES  Final diagnoses:  Influenza      NEW MEDICATIONS STARTED DURING THIS VISIT:  ED Discharge Orders        Ordered    ondansetron (ZOFRAN-ODT) 4 MG disintegrating tablet  Every 8 hours PRN     04/23/17 1934    fluticasone (FLONASE) 50 MCG/ACT nasal spray  2 times daily     04/23/17 1934    brompheniramine-pseudoephedrine-DM 30-2-10 MG/5ML syrup  4 times daily PRN     04/23/17 1934           This chart was dictated using voice recognition software/Dragon. Despite best efforts to proofread, errors can occur which can change the meaning. Any change was purely unintentional.    Racheal Patches, PA-C 04/23/17 Shela Commons, [place], MD 04/23/17 [phone]

## 2017-04-23 NOTE — ED Notes (Signed)
See triage note  States she developed fever,body aches with n/v on Friday   States she is able to take sips of fluids  Last time vomited was this am  Febrile on arrival

## 2017-06-27 ENCOUNTER — Emergency Department
Admission: EM | Admit: 2017-06-27 | Discharge: 2017-06-27 | Disposition: A | Discharge status: Home / Self Care | Payer: Self-pay | Attending: Emergency Medicine | Admitting: Emergency Medicine

## 2017-06-27 ENCOUNTER — Other Ambulatory Visit: Payer: Self-pay

## 2017-06-27 ENCOUNTER — Emergency Department: Payer: Self-pay

## 2017-06-27 DIAGNOSIS — R51 Headache: Secondary | ICD-10-CM | POA: Insufficient documentation

## 2017-06-27 DIAGNOSIS — Z79899 Other long term (current) drug therapy: Secondary | ICD-10-CM | POA: Insufficient documentation

## 2017-06-27 DIAGNOSIS — E119 Type 2 diabetes mellitus without complications: Secondary | ICD-10-CM | POA: Insufficient documentation

## 2017-06-27 DIAGNOSIS — F17210 Nicotine dependence, cigarettes, uncomplicated: Secondary | ICD-10-CM | POA: Insufficient documentation

## 2017-06-27 DIAGNOSIS — Z794 Long term (current) use of insulin: Secondary | ICD-10-CM | POA: Insufficient documentation

## 2017-06-27 MED ORDER — METHOCARBAMOL 500 MG PO TABS: 500.0000 mg | ORAL_TABLET | Freq: Three times a day (TID) | ORAL | 0 refills | Status: AC | PRN

## 2017-06-27 MED ORDER — PROMETHAZINE HCL 25 MG/ML IJ SOLN
12.5000 mg | Freq: Once | INTRAMUSCULAR | Status: AC
  Administered 2017-06-27: 12.5 mg via INTRAMUSCULAR

## 2017-06-27 MED ORDER — ONDANSETRON 4 MG PO TBDP: 4.0000 mg | ORAL_TABLET | Freq: Three times a day (TID) | ORAL | 0 refills | Status: AC | PRN

## 2017-06-27 MED ORDER — ONDANSETRON 8 MG PO TBDP
8.0000 mg | ORAL_TABLET | Freq: Once | ORAL | Status: AC
  Administered 2017-06-27: 8 mg via ORAL
  Filled 2017-06-27: qty 1

## 2017-06-27 MED ORDER — PROMETHAZINE HCL 25 MG/ML IJ SOLN
INTRAMUSCULAR | Status: AC
  Filled 2017-06-27: qty 1
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
## 2017-06-27 NOTE — ED Notes (Signed)
No CT to be done at this time per Dr. Derrill Kay
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
## 2017-06-27 NOTE — ED Notes (Signed)
In to give pt zofran. Pt is in no distress. Pt given zofran and notified for CT.

## 2017-11-13 ENCOUNTER — Emergency Department
Admission: EM | Admit: 2017-11-13 | Discharge: 2017-11-14 | Disposition: A | Discharge status: Home / Self Care | Payer: No Typology Code available for payment source | Attending: Emergency Medicine | Admitting: Emergency Medicine

## 2017-11-13 ENCOUNTER — Emergency Department: Payer: No Typology Code available for payment source

## 2017-11-13 ENCOUNTER — Other Ambulatory Visit: Payer: Self-pay

## 2017-11-13 ENCOUNTER — Encounter: Payer: Self-pay | Admitting: Emergency Medicine

## 2017-11-13 DIAGNOSIS — Z794 Long term (current) use of insulin: Secondary | ICD-10-CM | POA: Diagnosis not present

## 2017-11-13 DIAGNOSIS — R1013 Epigastric pain: Secondary | ICD-10-CM | POA: Diagnosis present

## 2017-11-13 DIAGNOSIS — E119 Type 2 diabetes mellitus without complications: Secondary | ICD-10-CM | POA: Diagnosis not present

## 2017-11-13 DIAGNOSIS — R1010 Upper abdominal pain, unspecified: Secondary | ICD-10-CM

## 2017-11-13 DIAGNOSIS — Z79899 Other long term (current) drug therapy: Secondary | ICD-10-CM | POA: Diagnosis not present

## 2017-11-13 DIAGNOSIS — F17210 Nicotine dependence, cigarettes, uncomplicated: Secondary | ICD-10-CM | POA: Insufficient documentation

## 2017-11-13 LAB — COMPREHENSIVE METABOLIC PANEL
ALBUMIN: 4.3 g/dL (ref 3.5–5.0)
ALT: 41 U/L (ref 0–44)
ANION GAP: 9 (ref 5–15)
AST: 29 U/L (ref 15–41)
Alkaline Phosphatase: 133 U/L — ABNORMAL HIGH (ref 38–126)
BUN: 10 mg/dL (ref 6–20)
CALCIUM: 10.2 mg/dL (ref 8.9–10.3)
CHLORIDE: 98 mmol/L (ref 98–111)
CO2: 28 mmol/L (ref 22–32)
Creatinine, Ser: 0.65 mg/dL (ref 0.44–1.00)
GFR calc non Af Amer: >60 mL/min (ref >60)
Glucose, Bld: 306 mg/dL — ABNORMAL HIGH (ref 70–99)
POTASSIUM: 3.7 mmol/L (ref 3.5–5.1)
SODIUM: 135 mmol/L (ref 135–145)
Total Bilirubin: 0.4 mg/dL (ref 0.3–1.2)
Total Protein: 8.3 g/dL — ABNORMAL HIGH (ref 6.5–8.1)

## 2017-11-13 LAB — URINALYSIS, COMPLETE (UACMP) WITH MICROSCOPIC
BACTERIA UA: NONE SEEN
Bilirubin Urine: NEGATIVE
Glucose, UA: >=500 mg/dL — AB
HGB URINE DIPSTICK: NEGATIVE
Ketones, ur: NEGATIVE mg/dL
Leukocytes, UA: NEGATIVE
Nitrite: NEGATIVE
PROTEIN: NEGATIVE mg/dL
Specific Gravity, Urine: 1 — ABNORMAL LOW (ref 1.005–1.030)
pH: 7 (ref 5.0–8.0)

## 2017-11-13 LAB — CBC
HEMATOCRIT: 42.8 % (ref 35.0–47.0)
HEMOGLOBIN: 14.5 g/dL (ref 12.0–16.0)
MCH: 29.4 pg (ref 26.0–34.0)
MCHC: 33.9 g/dL (ref 32.0–36.0)
MCV: 86.8 fL (ref 80.0–100.0)
Platelets: 461 10*3/uL — ABNORMAL HIGH (ref 150–440)
RBC: 4.93 MIL/uL (ref 3.80–5.20)
RDW: 14.2 % (ref 11.5–14.5)
WBC: 11.7 10*3/uL — ABNORMAL HIGH (ref 3.6–11.0)

## 2017-11-13 LAB — LIPASE, BLOOD: Lipase: 46 U/L (ref 11–51)

## 2017-11-13 MED ORDER — METOCLOPRAMIDE HCL 5 MG/ML IJ SOLN
10.0000 mg | Freq: Once | INTRAMUSCULAR | Status: AC
  Administered 2017-11-14: 10 mg via INTRAVENOUS
  Filled 2017-11-13: qty 2

## 2017-11-13 MED ORDER — HYDROMORPHONE HCL 1 MG/ML IJ SOLN
1.0000 mg | Freq: Once | INTRAMUSCULAR | Status: AC
  Administered 2017-11-13: 1 mg via INTRAVENOUS
  Filled 2017-11-13: qty 1

## 2017-11-13 MED ORDER — SODIUM CHLORIDE 0.9 % IV BOLUS
1000.0000 mL | Freq: Once | INTRAVENOUS | Status: AC
  Administered 2017-11-13: 1000 mL via INTRAVENOUS

## 2017-11-13 MED ORDER — HYDROMORPHONE HCL 1 MG/ML IJ SOLN
0.5000 mg | Freq: Once | INTRAMUSCULAR | Status: AC
  Administered 2017-11-14: 0.5 mg via INTRAVENOUS
  Filled 2017-11-13: qty 1

## 2017-11-13 MED ORDER — ONDANSETRON HCL 4 MG/2ML IJ SOLN
4.0000 mg | Freq: Once | INTRAMUSCULAR | Status: AC
  Administered 2017-11-13: 4 mg via INTRAVENOUS
  Filled 2017-11-13: qty 2

## 2017-11-13 MED ORDER — FAMOTIDINE IN NACL 20-0.9 MG/50ML-% IV SOLN
20.0000 mg | Freq: Once | INTRAVENOUS | Status: AC
  Administered 2017-11-14: 20 mg via INTRAVENOUS
  Filled 2017-11-13: qty 50

## 2017-11-13 MED ORDER — IOPAMIDOL (ISOVUE-300) INJECTION 61%
100.0000 mL | Freq: Once | INTRAVENOUS | Status: AC | PRN
  Administered 2017-11-13: 100 mL via INTRAVENOUS

## 2017-11-13 MED ORDER — ONDANSETRON 8 MG PO TBDP: 8.0000 mg | ORAL_TABLET | Freq: Three times a day (TID) | ORAL | 0 refills | Status: AC | PRN

## 2017-11-13 MED ORDER — DICYCLOMINE HCL 10 MG PO CAPS: 10.0000 mg | ORAL_CAPSULE | Freq: Four times a day (QID) | ORAL | 0 refills | Status: AC | PRN

## 2017-11-13 NOTE — ED Provider Notes (Addendum)
[facility] Emergency Department Provider Note ____________________________________________   First MD Initiated Contact with Patient 11/13/17 2119     (approximate)  I have reviewed the triage vital signs and the nursing notes.   HISTORY  Chief Complaint Abdominal Pain     HPI Carrie Graham is a 47 y.o. female with PMH as noted below as well as apparent history of pancreatitis who presents with epigastric abdominal pain, gradual onset over the last few days, radiating to her left flank and up to her chest, and feeling similar to prior pain from her pancreas.  She reports nausea with several episodes of vomiting today, as well as diarrhea.  Past Medical History:  Diagnosis Date  . Anemia   . Bleeding disorder (HCC)   . Diabetes mellitus without complication (HCC) 2014  . GERD (gastroesophageal reflux disease)   . IBS (irritable bowel syndrome)   . Insomnia   . Kidney stones   . Ulcer     There are no active problems to display for this patient.   Past Surgical History:  Procedure Laterality Date  . ABDOMINAL HYSTERECTOMY  2014  . ABDOMINOPLASTY  2000  . CESAREAN SECTION  [id number]  . CHOLECYSTECTOMY  1994  . HERNIA REPAIR  03-03-13   umbilical hernia  . LEFT OOPHORECTOMY  2014  . SHOULDER SURGERY      Prior to Admission medications   Medication Sig Start Date End Date Taking? Authorizing Provider  ALPRAZolam Prudy Feeler) 0.5 MG tablet Take 0.5 mg by mouth as needed for anxiety.    [provider]  amoxicillin-clavulanate (AUGMENTIN) 875-125 MG tablet Take 1 tablet by mouth 2 (two) times daily. 12/19/15   Cuthriell, Delorise Royals, PA-C  brompheniramine-pseudoephedrine-DM 30-2-10 MG/5ML syrup Take 10 mLs by mouth 4 (four) times daily as needed. 04/23/17   Cuthriell, Delorise Royals, PA-C  cyclobenzaprine (FLEXERIL) 5 MG tablet Take 1 tablet (5 mg total) by mouth 3 (three) times daily as needed for muscle spasms. 07/02/16   Little, Traci M, PA-C    dicyclomine (BENTYL) 10 MG capsule Take 1 capsule (10 mg total) by mouth 4 (four) times daily as needed for up to 5 days for spasms. 11/13/17 11/18/17  Dionne Bucy, MD  doxycycline (VIBRA-TABS) 100 MG tablet Take 1 tablet (100 mg total) by mouth 2 (two) times daily. 07/02/14   Ruffian, III Kristine Garbe, PA-C  escitalopram (LEXAPRO) 10 MG tablet Take 10 mg by mouth daily.    [provider]  fluticasone (FLONASE) 50 MCG/ACT nasal spray Place 1 spray into both nostrils 2 (two) times daily. 04/23/17   Cuthriell, Delorise Royals, PA-C  HYDROcodone-acetaminophen (NORCO/VICODIN) 5-325 MG tablet Take 1 tablet by mouth every 4 (four) hours as needed for moderate pain. 12/19/15   Cuthriell, Delorise Royals, PA-C  Insulin Aspart (NOVOLOG [place]) Inject into the skin 3 (three) times daily.    [provider]  insulin aspart (NOVOLOG) 100 UNIT/ML injection Per sliding scale 05/10/15 07/10/15  Darci Current, MD  Insulin Detemir (LEVEMIR [place]) Inject into the skin 2 (two) times daily.    [provider]  insulin detemir (LEVEMIR) 100 UNIT/ML injection Inject 0.14 mLs (14 Units total) into the skin 2 (two) times daily. 05/10/15   Darci Current, MD  loratadine (CLARITIN) 10 MG tablet Take 1 tablet (10 mg total) by mouth daily. 07/02/14 07/02/15  Garrel Ridgel, PA-C  ondansetron (ZOFRAN ODT) 8 MG disintegrating tablet Take 1 tablet (8 mg total) by mouth every 8 (  eight) hours as needed for nausea or vomiting. 11/13/17   Dionne Bucy, MD  oxyCODONE-acetaminophen (ROXICET) 5-325 MG tablet Take 1 tablet by mouth every 4 (four) hours as needed for severe pain. 07/14/15   Irean Hong, MD  sitaGLIPtin (JANUVIA) 100 MG tablet Take 100 mg by mouth daily.    [provider]  temazepam (RESTORIL) 15 MG capsule Take 15 mg by mouth at bedtime as needed for sleep.    [provider]  trimethoprim-polymyxin b (POLYTRIM) ophthalmic solution Place 2 drops into the left eye every 6 (six)  hours. 12/19/15   Cuthriell, Delorise Royals, PA-C    Allergies Nsaids; Oxycodone; Sulfa antibiotics; and Toradol [ketorolac tromethamine]  Family History  Problem Relation Age of Onset  . Cancer Mother        ovarian    Social History Social History   Tobacco Use  . Smoking status: Current Every Day Smoker    Packs/day: 0.50    Types: Cigarettes  . Smokeless tobacco: Never Used  Substance Use Topics  . Alcohol use: No  . Drug use: No    Review of Systems  Constitutional: No fever. Eyes: No redness. ENT: No sore throat. Cardiovascular: Denies chest pain. Respiratory: Denies shortness of breath. Gastrointestinal: Positive for vomiting.  Genitourinary: Negative for flank pain.  Musculoskeletal: Negative for back pain. Skin: Negative for rash. Neurological: Negative for headache.   ____________________________________________   PHYSICAL EXAM:  VITAL SIGNS: ED Triage Vitals [11/13/17 2032]  Enc Vitals Group     BP 136/65     Pulse Rate 92     Resp 17     Temp 98.1 F (36.7 C)     Temp Source Oral     SpO2 98 %     Weight 154 lb (69.9 kg)     Height 5' 7.5" (1.715 m)     Head Circumference      Peak Flow      Pain Score 10     Pain Loc      Pain Edu?      Excl. in GC?     Constitutional: Alert and oriented.  Uncomfortable appearing but in no acute distress. Eyes: Conjunctivae are normal.  No scleral icterus. Head: Atraumatic. Nose: No congestion/rhinnorhea. Mouth/Throat: Mucous membranes are slightly dry.   Neck: Normal range of motion.  Cardiovascular: Good peripheral circulation. Respiratory: Normal respiratory effort.  Gastrointestinal: Soft with moderate epigastric tenderness. No distention.  Genitourinary: No flank tenderness. Musculoskeletal: Extremities warm and well perfused.  Neurologic:  Normal speech and language. No gross focal neurologic deficits are appreciated.  Skin:  Skin is warm and dry. No rash noted. Psychiatric: Mood and affect  are normal. Speech and behavior are normal.  ____________________________________________   LABS (all labs ordered are listed, but only abnormal results are displayed)  Labs Reviewed  COMPREHENSIVE METABOLIC PANEL - Abnormal; Notable for the following components:      Result Value   Glucose, Bld 306 (*)    Total Protein 8.3 (*)    Alkaline Phosphatase 133 (*)    All other components within normal limits  CBC - Abnormal; Notable for the following components:   WBC 11.7 (*)    Platelets 461 (*)    All other components within normal limits  URINALYSIS, COMPLETE (UACMP) WITH MICROSCOPIC - Abnormal; Notable for the following components:   Color, Urine COLORLESS (*)    APPearance CLEAR (*)    Specific Gravity, Urine 1.000 (*)    Glucose,  UA >=500 (*)    All other components within normal limits  LIPASE, BLOOD   ____________________________________________  EKG  ED ECG REPORT I, Dionne BucySebastian Tateanna Bach, the attending physician, personally viewed and interpreted this ECG.  Date: 11/13/2017 EKG Time: 2037 Rate: 84 Rhythm: normal sinus rhythm QRS Axis: normal Intervals: normal ST/T Wave abnormalities: Nonspecific ST abnormalities in the inferior leads Narrative Interpretation: Nonspecific inferior ST abnormality, not seen in EKG from 06/28/2017 but similar morphology to EKG from 05/07/2016   ____________________________________________  RADIOLOGY  CT abdomen: No pancreatitis.  No acute abnormalities.  ____________________________________________   PROCEDURES  Procedure(s) performed: No  Procedures  Critical Care performed: No ____________________________________________   INITIAL IMPRESSION / ASSESSMENT AND PLAN / ED COURSE  Pertinent labs & imaging results that were available during my care of the patient were reviewed by me and considered in my medical decision making (see chart for details).  47 year old female with PMH as noted above presents with epigastric pain  with nausea and vomiting over the last few days.  She states she believes it is related to her pancreas, or due to her diabetes.  On exam, the patient is uncomfortable but relatively well-appearing and has normal vital signs.  She is significantly tender in the epigastric area and somewhat in the left upper quadrant.  The remainder of the exam is as described above.  Differential includes pancreatitis, gastritis, gastroparesis, gastroenteritis, or less likely colitis or other intra-abdominal cause.  The patient's initial labs reveal a normal lipase.  Given the patient's significant tenderness and the negative lipase I will obtain a CT to rule out acute on chronic pancreatitis or the other causes above.  ----------------------------------------- 11:56 PM on 11/13/2017 -----------------------------------------  Lab work-up and CT are negative.  The patient is feeling better on reassessment although she still does have some pain.  Her pain is now more in the left upper quadrant.  I suspect most likely gastritis or PUD which the patient has a history of.  We will give some additional analgesia nausea medication, but the patient is stable for discharge home.  We will give her GI referral.  Return precautions given, and she expresses understanding.  ____________________________________________   FINAL CLINICAL IMPRESSION(S) / ED DIAGNOSES  Final diagnoses:  Pain of upper abdomen      NEW MEDICATIONS STARTED DURING THIS VISIT:  New Prescriptions   DICYCLOMINE (BENTYL) 10 MG CAPSULE    Take 1 capsule (10 mg total) by mouth 4 (four) times daily as needed for up to 5 days for spasms.   ONDANSETRON (ZOFRAN ODT) 8 MG DISINTEGRATING TABLET    Take 1 tablet (8 mg total) by mouth every 8 (eight) hours as needed for nausea or vomiting.     Note:  This document was prepared using Dragon voice recognition software and may include unintentional dictation errors.     Dionne BucySiadecki, Kashena Novitski, MD 11/13/17  [id number]    Dionne BucySiadecki, Refujio Haymer, MD 12/06/17 1825

## 2017-11-13 NOTE — Discharge Instructions (Addendum)
Make an appointment to follow-up with the gastroenterologist; referral has been provided.  Return to the ER for new, worsening, persistent severe

## 2017-11-13 NOTE — ED Triage Notes (Signed)
Pt comes into the ED via POV c/o LUQ abdominal pain.  Patient has h/o pancreatitis and states she felt the pain starting a couple of days ago and was hoping it would go away but today it got worse.  Patient is also diabetic.  Patient in NAD at this time with even and unlabored respirations.  Patient states the abdominal pain shoots into the left side of her chest and into her back. Denies any cardiac problems in the past.

## 2017-11-14 DIAGNOSIS — R1013 Epigastric pain: Secondary | ICD-10-CM | POA: Diagnosis not present

## 2018-09-03 ENCOUNTER — Other Ambulatory Visit: Payer: Self-pay

## 2018-09-03 DIAGNOSIS — G8918 Other acute postprocedural pain: Secondary | ICD-10-CM | POA: Insufficient documentation

## 2018-09-03 DIAGNOSIS — Z5321 Procedure and treatment not carried out due to patient leaving prior to being seen by health care provider: Secondary | ICD-10-CM | POA: Diagnosis not present

## 2018-09-04 ENCOUNTER — Other Ambulatory Visit: Payer: Self-pay

## 2018-09-04 ENCOUNTER — Encounter: Payer: Self-pay | Admitting: *Deleted

## 2018-09-04 ENCOUNTER — Emergency Department
Admission: EM | Admit: 2018-09-04 | Discharge: 2018-09-04 | Disposition: A | Discharge status: Home / Self Care | Payer: No Typology Code available for payment source | Attending: Emergency Medicine | Admitting: Emergency Medicine

## 2018-09-04 DIAGNOSIS — G8918 Other acute postprocedural pain: Secondary | ICD-10-CM | POA: Diagnosis not present

## 2018-09-04 LAB — CBC
HCT: 40.9 % (ref 36.0–46.0)
Hemoglobin: 13.5 g/dL (ref 12.0–15.0)
MCH: 28.1 pg (ref 26.0–34.0)
MCHC: 33 g/dL (ref 30.0–36.0)
MCV: 85 fL (ref 80.0–100.0)
Platelets: 402 10*3/uL — ABNORMAL HIGH (ref 150–400)
RBC: 4.81 MIL/uL (ref 3.87–5.11)
RDW: 13.8 % (ref 11.5–15.5)
WBC: 9.4 10*3/uL (ref 4.0–10.5)
nRBC: 0 % (ref 0.0–0.2)

## 2018-09-04 LAB — BASIC METABOLIC PANEL
Anion gap: 11 (ref 5–15)
BUN: 12 mg/dL (ref 6–20)
CO2: 24 mmol/L (ref 22–32)
Calcium: 9.5 mg/dL (ref 8.9–10.3)
Chloride: 99 mmol/L (ref 98–111)
Creatinine, Ser: 0.77 mg/dL (ref 0.44–1.00)
GFR calc Af Amer: >60 mL/min (ref >60)
GFR calc non Af Amer: >60 mL/min (ref >60)
Glucose, Bld: 408 mg/dL — ABNORMAL HIGH (ref 70–99)
Potassium: 3.7 mmol/L (ref 3.5–5.1)
Sodium: 134 mmol/L — ABNORMAL LOW (ref 135–145)

## 2018-09-04 NOTE — ED Triage Notes (Signed)
Pt had procedure done at [facility] 3 days ago.   Pt is diabetic.  Pt had cyst removed from left labia area and right buttock area.  Tonight pt continues to have bleeding and pain.  Pt alert   Speech clear.

## 2019-06-09 IMAGING — CT CT ABD-PELV W/ CM
2 of 5 series · 15 of 46 positions shown, 17 images · IV contrast (APPLIED)
Comparison: 07/14/2015

CLINICAL DATA: Left upper quadrant pain. History of hernia repair,
left oophorectomy, hysterectomy and cholecystectomy.

EXAM:
CT ABDOMEN AND PELVIS WITH CONTRAST
TECHNIQUE: Multidetector CT imaging of the abdomen and pelvis was performed
using the standard protocol following bolus administration of
intravenous contrast.
CONTRAST:  100mL [id number] IOPAMIDOL ([id number]) INJECTION 61%

[Series 2: routine abd/pel with · axial · 0.75mm/px · z∈[-1042,-617]mm · 12 of 95 slices shown, 14 images]
[im 5/95  soft-tissue]
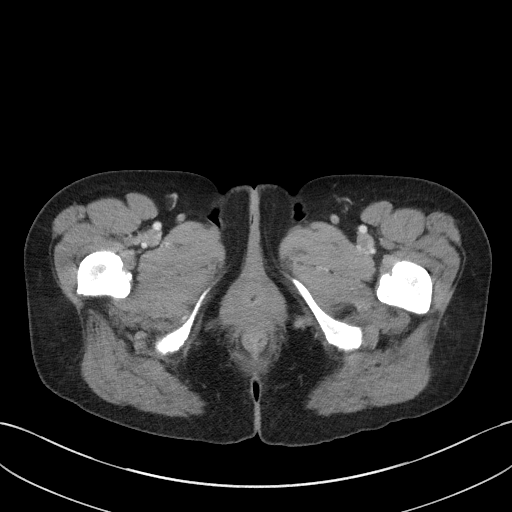
[im 5/95  bone]
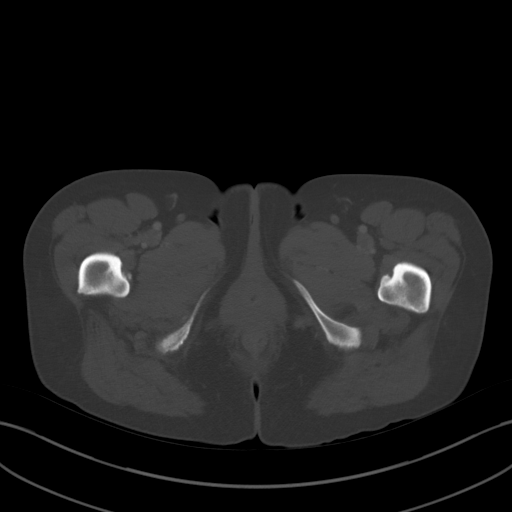
[im 15/95  soft-tissue]
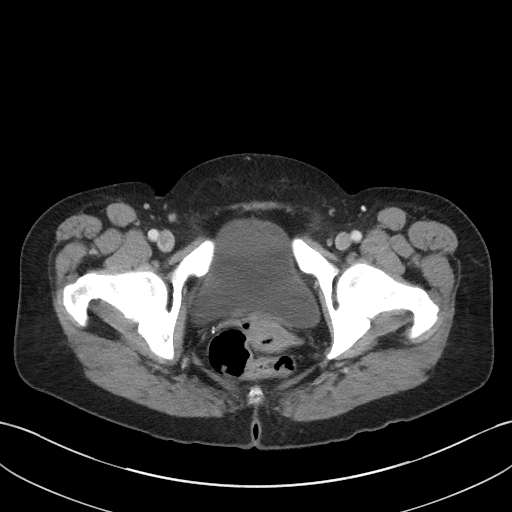
[im 20/95  soft-tissue]
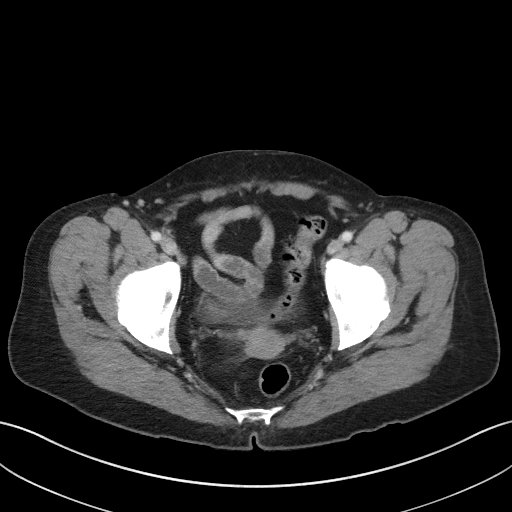
[im 30/95  soft-tissue]
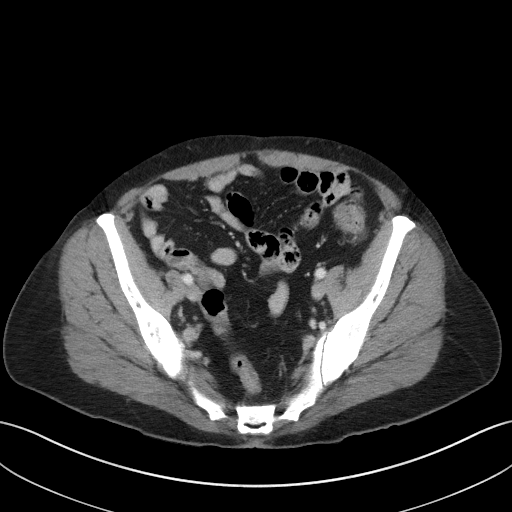
[im 35/95  soft-tissue]
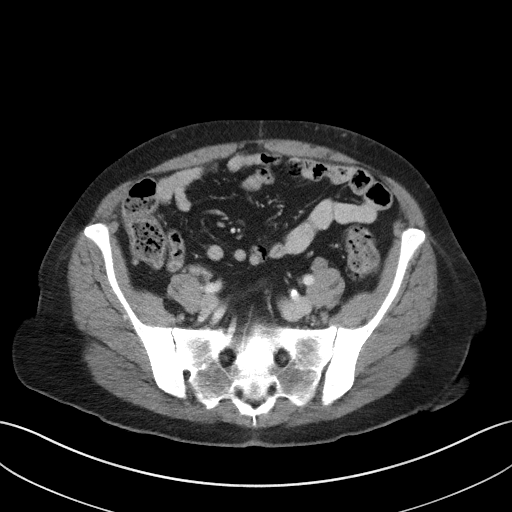
[im 45/95  soft-tissue]
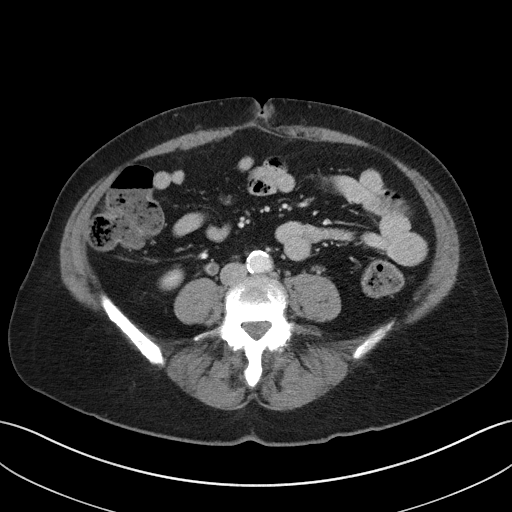
[im 50/95  soft-tissue]
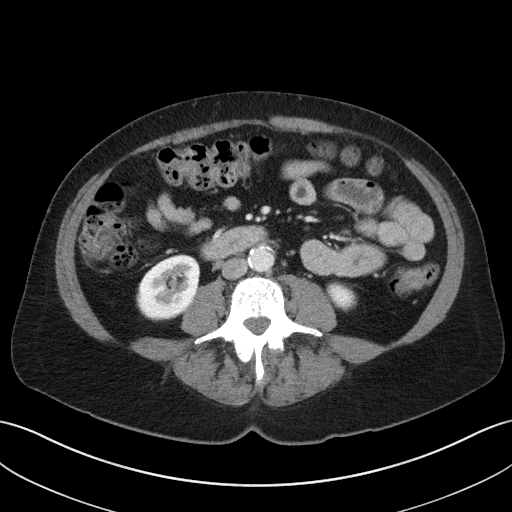
[im 60/95  soft-tissue]
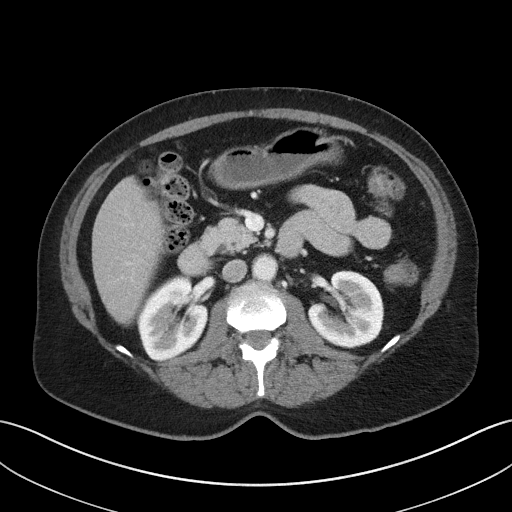
[im 65/95  soft-tissue]
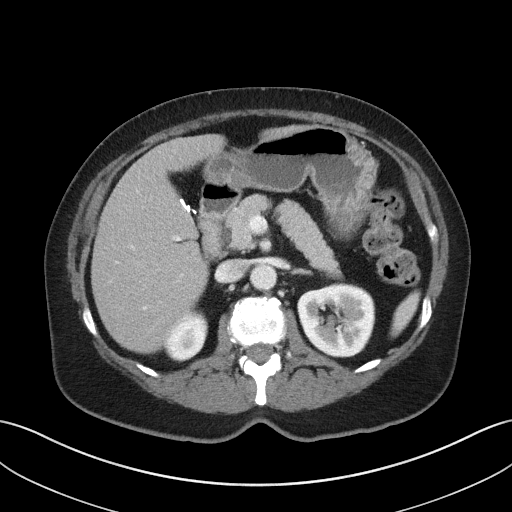
[im 65/95  bone]
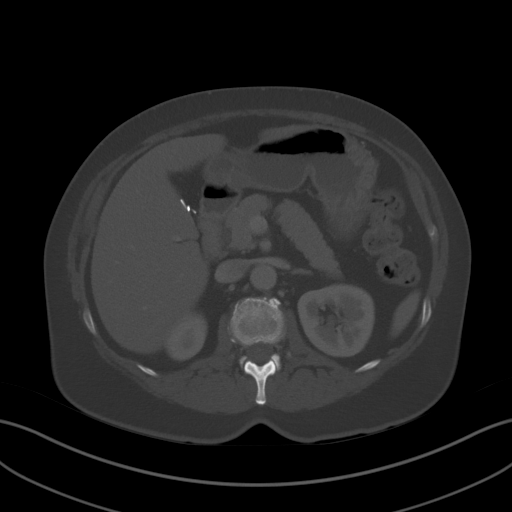
[im 75/95  soft-tissue]
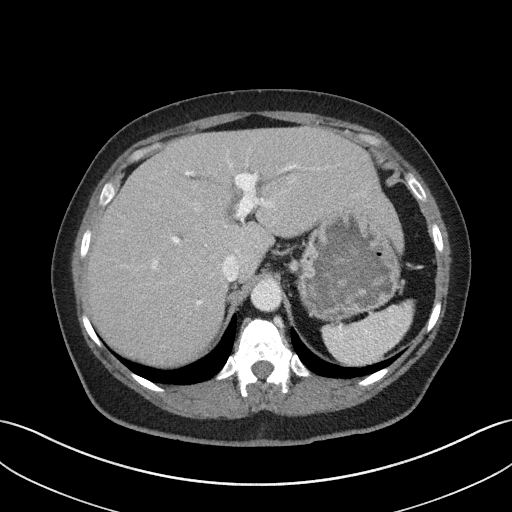
[im 80/95  soft-tissue]
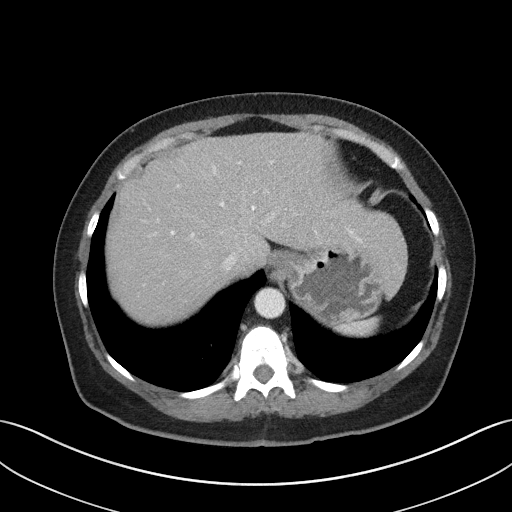
[im 90/95  soft-tissue]
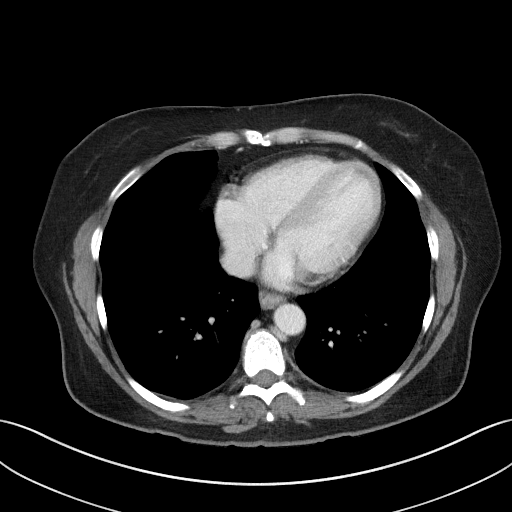

[Series 5: coronal st · coronal · 0.68mm/px · 3 of 86 slices shown]
[im 29/86  soft-tissue]
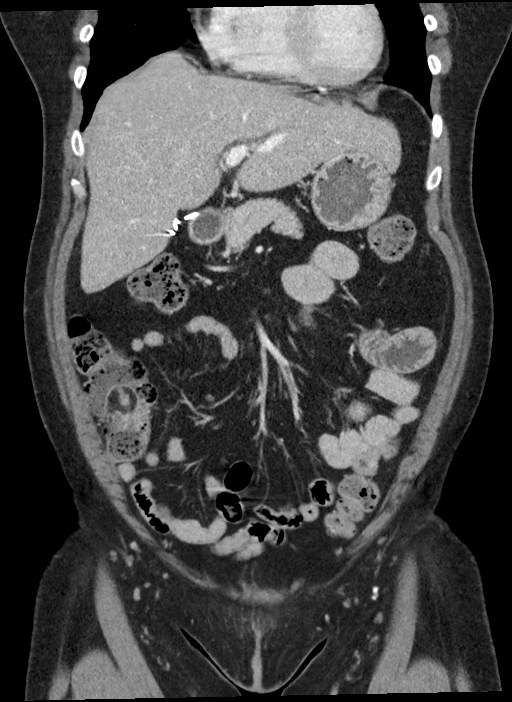
[im 38/86  soft-tissue]
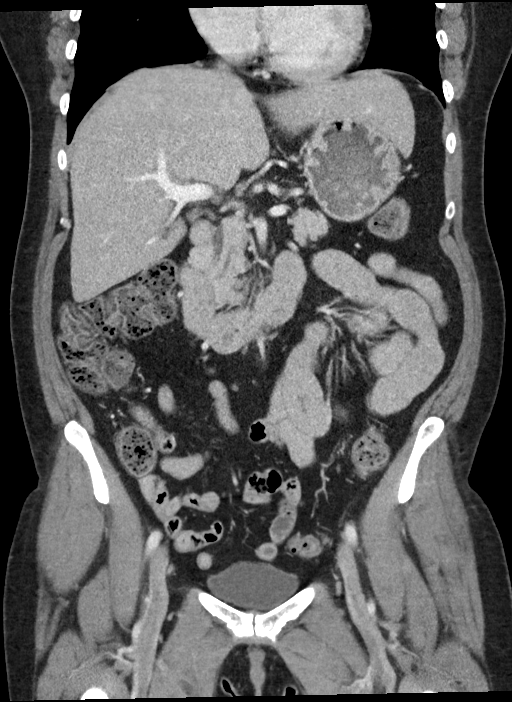
[im 48/86  soft-tissue]
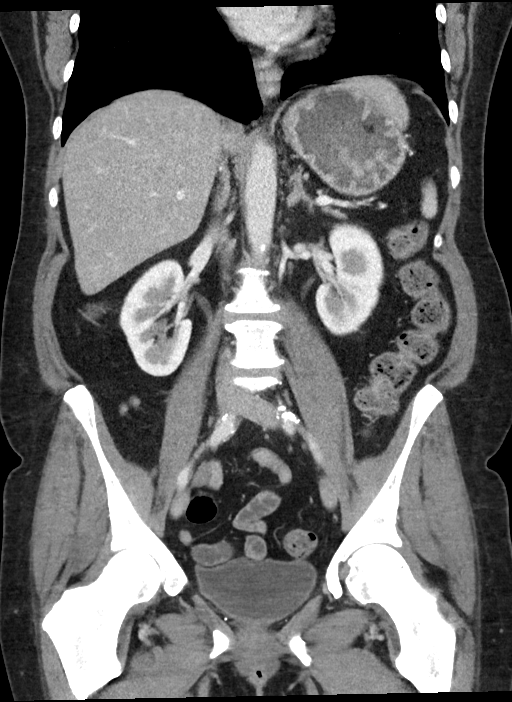

[15 of 46 positions shown; findings below may reference images not displayed]

FINDINGS: LOWER CHEST: Lung bases are clear. Included heart size is normal. No
pericardial effusion. Bases appear clear.

HEPATOBILIARY: Mild fatty infiltration of the liver without
space-occupying mass. No biliary dilatation. Cholecystectomy clips
are present.

PANCREAS: Normal

SPLEEN: Normal

ADRENALS/URINARY TRACT: Kidneys are orthotopic, demonstrating
symmetric enhancement. Simple appearing cysts noted off the upper
pole the left kidney measuring between 11 and 12 mm. No
nephrolithiasis, hydronephrosis or solid renal masses. Urinary
bladder is partially distended and unremarkable. Normal adrenal
glands.

STOMACH/BOWEL: Moderate fluid-filled distention of the stomach with
normal small bowel rotation. No bowel obstruction or inflammation.
The distal and terminal ileum as well as appendix are normal.
Moderate stool retention throughout the colon consistent
constipation. Scattered colonic diverticulosis without acute
diverticulitis.

VASCULAR/LYMPHATIC: Moderate aortoiliac and branch vessel
atherosclerosis without aneurysm or dissection. Adenopathy.

REPRODUCTIVE: The uterus is unremarkable.  No adnexal mass.

OTHER: No intraperitoneal free fluid or free air. Status post
umbilical hernia repair.

MUSCULOSKELETAL: No acute nor suspicious osseous abnormalities.
IMPRESSION: 1. Increased fecal retention throughout the colon consistent
constipation. Scattered colonic diverticulosis without acute
diverticulitis.
2. Mild fatty infiltration of the liver as before.
3. Simple appearing cysts in the upper pole the left kidney
measuring up to 12 mm.
4. No acute intra-abdominal or pelvic abnormality.

## 2020-04-02 ENCOUNTER — Encounter: Payer: Self-pay | Admitting: Emergency Medicine

## 2020-04-02 ENCOUNTER — Emergency Department: Payer: No Typology Code available for payment source

## 2020-04-02 ENCOUNTER — Emergency Department
Admission: EM | Admit: 2020-04-02 | Discharge: 2020-04-03 | Disposition: A | Discharge status: Home / Self Care | Payer: No Typology Code available for payment source | Attending: Emergency Medicine | Admitting: Emergency Medicine

## 2020-04-02 DIAGNOSIS — F17210 Nicotine dependence, cigarettes, uncomplicated: Secondary | ICD-10-CM | POA: Insufficient documentation

## 2020-04-02 DIAGNOSIS — R109 Unspecified abdominal pain: Secondary | ICD-10-CM | POA: Diagnosis present

## 2020-04-02 DIAGNOSIS — E119 Type 2 diabetes mellitus without complications: Secondary | ICD-10-CM | POA: Insufficient documentation

## 2020-04-02 DIAGNOSIS — Z794 Long term (current) use of insulin: Secondary | ICD-10-CM | POA: Insufficient documentation

## 2020-04-02 LAB — URINALYSIS, COMPLETE (UACMP) WITH MICROSCOPIC
Bacteria, UA: NONE SEEN
Bilirubin Urine: NEGATIVE
Glucose, UA: NEGATIVE mg/dL
Hgb urine dipstick: NEGATIVE
Ketones, ur: NEGATIVE mg/dL
Leukocytes,Ua: NEGATIVE
Nitrite: NEGATIVE
Protein, ur: NEGATIVE mg/dL
Specific Gravity, Urine: 1.011 (ref 1.005–1.030)
pH: 6 (ref 5.0–8.0)

## 2020-04-02 LAB — CBC
HCT: 37.6 % (ref 36.0–46.0)
Hemoglobin: 12.5 g/dL (ref 12.0–15.0)
MCH: 28.3 pg (ref 26.0–34.0)
MCHC: 33.2 g/dL (ref 30.0–36.0)
MCV: 85.1 fL (ref 80.0–100.0)
Platelets: 413 10*3/uL — ABNORMAL HIGH (ref 150–400)
RBC: 4.42 MIL/uL (ref 3.87–5.11)
RDW: 15 % (ref 11.5–15.5)
WBC: 12.4 10*3/uL — ABNORMAL HIGH (ref 4.0–10.5)
nRBC: 0 % (ref 0.0–0.2)

## 2020-04-02 LAB — CBG MONITORING, ED: Glucose-Capillary: 62 mg/dL — ABNORMAL LOW (ref 70–99)

## 2020-04-02 LAB — COMPREHENSIVE METABOLIC PANEL
ALT: 23 U/L (ref 0–44)
AST: 19 U/L (ref 15–41)
Albumin: 4 g/dL (ref 3.5–5.0)
Alkaline Phosphatase: 102 U/L (ref 38–126)
Anion gap: 8 (ref 5–15)
BUN: 14 mg/dL (ref 6–20)
CO2: 29 mmol/L (ref 22–32)
Calcium: 9.1 mg/dL (ref 8.9–10.3)
Chloride: 101 mmol/L (ref 98–111)
Creatinine, Ser: 0.68 mg/dL (ref 0.44–1.00)
GFR, Estimated: >60 mL/min (ref >60)
Glucose, Bld: 104 mg/dL — ABNORMAL HIGH (ref 70–99)
Potassium: 3.5 mmol/L (ref 3.5–5.1)
Sodium: 138 mmol/L (ref 135–145)
Total Bilirubin: 0.3 mg/dL (ref 0.3–1.2)
Total Protein: 7.6 g/dL (ref 6.5–8.1)

## 2020-04-02 LAB — LIPASE, BLOOD: Lipase: 35 U/L (ref 11–51)

## 2020-04-02 MED ORDER — HYDROCODONE-ACETAMINOPHEN 5-325 MG PO TABS
2.0000 | ORAL_TABLET | Freq: Once | ORAL | Status: AC
  Administered 2020-04-03: 2 via ORAL
  Filled 2020-04-02: qty 2

## 2020-04-02 NOTE — ED Notes (Signed)
Pt given 4oz of orange juice.

## 2020-04-02 NOTE — ED Triage Notes (Signed)
Pt c/o right flank and lower back pain x2 weeks, worsening in last 24 hours as well as swelling at the right kidney area in lower back. Pt reports she had a recent change in long acting insulin 2 weeks ago and since has had pain in the kidney area. Had lab work drawn on Friday with normal results. Pt reports today when she laid down that she could feel a hard swollen area where her right kidney is located.    CBG in triage 69

## 2020-04-03 LAB — CBG MONITORING, ED: Glucose-Capillary: 68 mg/dL — ABNORMAL LOW (ref 70–99)

## 2020-04-03 MED ORDER — CYCLOBENZAPRINE HCL 5 MG PO TABS: 5.0000 mg | ORAL_TABLET | Freq: Three times a day (TID) | ORAL | 0 refills | Status: AC | PRN

## 2020-04-03 MED ORDER — HYDROCODONE-ACETAMINOPHEN 5-325 MG PO TABS: 2.0000 | ORAL_TABLET | Freq: Four times a day (QID) | ORAL | 0 refills | Status: AC | PRN

## 2020-04-03 NOTE — Discharge Instructions (Signed)
Your labs, urine and CT scan today were very reassuring.  I suspect that your pain is musculoskeletal in nature.  If you develop worsening pain that is uncontrolled with medications that have been provided for you today, numbness or weakness in your extremities, difficulty holding your bowel or bladder, fever of 100.4 or higher, please return to the emergency department.  Recommend close follow-up with your primary care physician if symptoms or not improving.

## 2020-04-03 NOTE — ED Provider Notes (Signed)
[facility] Emergency Department Provider Note  ____________________________________________   Event Date/Time   First MD Initiated Contact with Patient 04/02/20 2332     (approximate)  I have reviewed the triage vital signs and the nursing notes.   HISTORY  Chief Complaint Flank Pain    HPI Carrie Graham is a 50 y.o. female with history of insulin-dependent diabetes, kidney stones who presents to the emergency department right flank pain for the past 2 weeks.  Pain is worse with palpation, movement.  No injury that she can recall.  No numbness, tingling, weakness, bowel or bladder incontinence, fever.  No nausea, vomiting, diarrhea.  No dysuria or hematuria.  No vaginal bleeding or discharge.  States she does feel like she has a difficult time completely emptying her bladder and has to shift around when on the toilet.  She states she is concerned this is her kidney.  She is also worried this could be related to starting a new insulin regimen 2 weeks ago.        Past Medical History:  Diagnosis Date  . Anemia   . Bleeding disorder (HCC)   . Diabetes mellitus without complication (HCC) 2014  . GERD (gastroesophageal reflux disease)   . IBS (irritable bowel syndrome)   . Insomnia   . Kidney stones   . Ulcer     There are no problems to display for this patient.   Past Surgical History:  Procedure Laterality Date  . ABDOMINAL HYSTERECTOMY  2014  . ABDOMINOPLASTY  2000  . CESAREAN SECTION  [id number]  . CHOLECYSTECTOMY  1994  . HERNIA REPAIR  03-03-13   umbilical hernia  . LEFT OOPHORECTOMY  2014  . SHOULDER SURGERY      Prior to Admission medications   Medication Sig Start Date End Date Taking? Authorizing Provider  cyclobenzaprine (FLEXERIL) 5 MG tablet Take 1 tablet (5 mg total) by mouth 3 (three) times daily as needed for muscle spasms. 04/03/20  Yes Imer Foxworth, Layla Maw, DO  HYDROcodone-acetaminophen (NORCO) 5-325 MG tablet Take 2 tablets by  mouth every 6 (six) hours as needed for moderate pain. 04/03/20  Yes Murlean Seelye, Layla Maw, DO  ALPRAZolam (XANAX) 0.5 MG tablet Take 0.5 mg by mouth as needed for anxiety.    [provider]  amoxicillin-clavulanate (AUGMENTIN) 875-125 MG tablet Take 1 tablet by mouth 2 (two) times daily. 12/19/15   Cuthriell, Delorise Royals, PA-C  brompheniramine-pseudoephedrine-DM 30-2-10 MG/5ML syrup Take 10 mLs by mouth 4 (four) times daily as needed. 04/23/17   Cuthriell, Delorise Royals, PA-C  dicyclomine (BENTYL) 10 MG capsule Take 1 capsule (10 mg total) by mouth 4 (four) times daily as needed for up to 5 days for spasms. 11/13/17 11/18/17  Dionne Bucy, MD  doxycycline (VIBRA-TABS) 100 MG tablet Take 1 tablet (100 mg total) by mouth 2 (two) times daily. 07/02/14   Ruffian, III Kristine Garbe, PA-C  escitalopram (LEXAPRO) 10 MG tablet Take 10 mg by mouth daily.    [provider]  fluticasone (FLONASE) 50 MCG/ACT nasal spray Place 1 spray into both nostrils 2 (two) times daily. 04/23/17   Cuthriell, Delorise Royals, PA-C  Insulin Aspart (NOVOLOG [place]) Inject into the skin 3 (three) times daily.    [provider]  insulin aspart (NOVOLOG) 100 UNIT/ML injection Per sliding scale 05/10/15 07/10/15  Darci Current, MD  Insulin Detemir (LEVEMIR [place]) Inject into the skin 2 (two) times daily.    [provider]  insulin detemir (LEVEMIR) 100  UNIT/ML injection Inject 0.14 mLs (14 Units total) into the skin 2 (two) times daily. 05/10/15   Darci Current, MD  loratadine (CLARITIN) 10 MG tablet Take 1 tablet (10 mg total) by mouth daily. 07/02/14 07/02/15  Ruffian, III Kristine Garbe, PA-C  ondansetron (ZOFRAN ODT) 8 MG disintegrating tablet Take 1 tablet (8 mg total) by mouth every 8 (eight) hours as needed for nausea or vomiting. 11/13/17   Dionne Bucy, MD  oxyCODONE-acetaminophen (ROXICET) 5-325 MG tablet Take 1 tablet by mouth every 4 (four) hours as needed for severe pain. 07/14/15   Irean Hong, MD   sitaGLIPtin (JANUVIA) 100 MG tablet Take 100 mg by mouth daily.    [provider]  temazepam (RESTORIL) 15 MG capsule Take 15 mg by mouth at bedtime as needed for sleep.    [provider]  trimethoprim-polymyxin b (POLYTRIM) ophthalmic solution Place 2 drops into the left eye every 6 (six) hours. 12/19/15   Cuthriell, Delorise Royals, PA-C    Allergies Nsaids, Oxycodone, Sulfa antibiotics, and Toradol [ketorolac tromethamine]  Family History  Problem Relation Age of Onset  . Cancer Mother        ovarian    Social History Social History   Tobacco Use  . Smoking status: Current Every Day Smoker    Packs/day: 0.50    Types: Cigarettes  . Smokeless tobacco: Never Used  Substance Use Topics  . Alcohol use: No  . Drug use: No    Review of Systems Constitutional: No fever. Eyes: No visual changes. ENT: No sore throat. Cardiovascular: Denies chest pain. Respiratory: Denies shortness of breath. Gastrointestinal: No nausea, vomiting, diarrhea. Genitourinary: Negative for dysuria. Musculoskeletal: + back pain. Skin: Negative for rash. Neurological: Negative for focal weakness or numbness.  ____________________________________________   PHYSICAL EXAM:  VITAL SIGNS: ED Triage Vitals [04/02/20 2152]  Enc Vitals Group     BP 132/76     Pulse Rate 83     Resp 18     Temp 98.1 F (36.7 C)     Temp Source Oral     SpO2 98 %     Weight      Height      Head Circumference      Peak Flow      Pain Score      Pain Loc      Pain Edu?      Excl. in GC?    CONSTITUTIONAL: Alert and oriented and responds appropriately to questions. Well-appearing; well-nourished HEAD: Normocephalic EYES: Conjunctivae clear, pupils appear equal, EOM appear intact ENT: normal nose; moist mucous membranes NECK: Supple, normal ROM CARD: RRR; S1 and S2 appreciated; no murmurs, no clicks, no rubs, no gallops RESP: Normal chest excursion without splinting or tachypnea; breath  sounds clear and equal bilaterally; no wheezes, no rhonchi, no rales, no hypoxia or respiratory distress, speaking full sentences ABD/GI: Normal bowel sounds; non-distended; soft, non-tender, no rebound, no guarding, no peritoneal signs, no hepatosplenomegaly BACK: No midline spinal tenderness, step-off or deformity.  Patient tender to palpation over the right flank without significant soft tissue swelling, redness, warmth, ecchymosis or other lesions noted.  Pain is reproducible with palpation. EXT: Normal ROM in all joints; no deformity noted, no edema; no cyanosis SKIN: Normal color for age and race; warm; no rash on exposed skin NEURO: Moves all extremities equally, normal sensation diffusely, normal speech, no clonus, no saddle anesthesia, ambulates with normal gait PSYCH: The patient's mood and manner are appropriate.  ____________________________________________  LABS (all labs ordered are listed, but only abnormal results are displayed)  Labs Reviewed  COMPREHENSIVE METABOLIC PANEL - Abnormal; Notable for the following components:      Result Value   Glucose, Bld 104 (*)    All other components within normal limits  CBC - Abnormal; Notable for the following components:   WBC 12.4 (*)    Platelets 413 (*)    All other components within normal limits  URINALYSIS, COMPLETE (UACMP) WITH MICROSCOPIC - Abnormal; Notable for the following components:   Color, Urine YELLOW (*)    APPearance HAZY (*)    All other components within normal limits  CBG MONITORING, ED - Abnormal; Notable for the following components:   Glucose-Capillary 62 (*)    All other components within normal limits  CBG MONITORING, ED - Abnormal; Notable for the following components:   Glucose-Capillary 68 (*)    All other components within normal limits  LIPASE, BLOOD   ____________________________________________  EKG  None ____________________________________________  RADIOLOGY I, Fanchon Papania,  personally viewed and evaluated these images (plain radiographs) as part of my medical decision making, as well as reviewing the written report by the radiologist.  ED MD interpretation: CT scan shows no acute abnormality.  Official radiology report(s): CT Renal Stone Study  Result Date: 04/03/2020 CLINICAL DATA:  Right flank pain EXAM: CT ABDOMEN AND PELVIS WITHOUT CONTRAST TECHNIQUE: Multidetector CT imaging of the abdomen and pelvis was performed following the standard protocol without IV contrast. COMPARISON:  11/13/2017 FINDINGS: Lower chest: Lung bases are clear. No effusions. Heart is normal size. Hepatobiliary: No focal liver abnormality is seen. Status post cholecystectomy. No biliary dilatation. Pancreas: No focal abnormality or ductal dilatation. Spleen: No focal abnormality.  Normal size. Adrenals/Urinary Tract: No adrenal abnormality. No focal renal abnormality. No stones or hydronephrosis. Urinary bladder is unremarkable. Stomach/Bowel: Normal appendix. Stomach, large and small bowel grossly unremarkable. Vascular/Lymphatic: Aortic atherosclerosis. No evidence of aneurysm or adenopathy. Reproductive: Uterus and adnexa unremarkable.  No mass. Other: No free fluid or free air. Musculoskeletal: No acute bony abnormality. IMPRESSION: No renal or ureteral stones.  No hydronephrosis. Normal appendix. No acute findings. Electronically Signed   By: Charlett Nose M.D.   On: 04/03/2020 00:15    ____________________________________________   PROCEDURES  Procedure(s) performed (including Critical Care):    ____________________________________________   INITIAL IMPRESSION / ASSESSMENT AND PLAN / ED COURSE  As part of my medical decision making, I reviewed the following data within the electronic MEDICAL RECORD NUMBER Nursing notes reviewed and incorporated, Labs reviewed, Old chart reviewed, Notes from prior ED visits and [place] Controlled Substance Database         Patient here with right-sided  flank pain that has been worsening for 2 weeks.  She is concerned that this is her kidney.  She does have history of kidney stones.  I am more concerned that this is musculoskeletal in nature.  Her labs do show slight leukocytosis but otherwise unremarkable.  Normal creatinine.  Urine does not appear infected and no blood.  She has no focal neurologic deficits on exam.  We will proceed with CT renal study for further evaluation.  She states she is able to take Norco for pain.  Will give dose in the emergency department.     12:35 AM  Pt's CT scan shows no acute abnormality.  No renal stones, hydronephrosis, hydroureter, perinephric stranding.  Normal-appearing appendix.  Suspect this is musculoskeletal in nature.  12:58 AM  Pt's repeat blood  sugar 68.  Discussed with patient that is still slightly low.  She declines any further food, drink here in the emergency department stating that she would like to go home and eat.  She is mentating normally and has a continuous glucose monitor to monitor her blood sugar closely at home.  Discharge with Norco, Flexeril to take as needed.  Unable to take NSAIDs.  Recommended close follow-up with her primary care physician.  No red flag symptoms to suggest cauda equina, epidural abscess or hematoma, discitis or osteomyelitis, transverse myelitis, spinal stenosis.  I do not feel at this time she needs further emergent imaging.  CT scan shows no fracture, dislocation.   At this time, I do not feel there is any life-threatening condition present. I have reviewed, interpreted and discussed all results (EKG, imaging, lab, urine as appropriate) and exam findings with patient/family. I have reviewed nursing notes and appropriate previous records.  I feel the patient is safe to be discharged home without further emergent workup and can continue workup as an outpatient as needed. Discussed usual and customary return precautions. Patient/family verbalize understanding and are  comfortable with this plan.  Outpatient follow-up has been provided as needed. All questions have been answered.  ____________________________________________   FINAL CLINICAL IMPRESSION(S) / ED DIAGNOSES  Final diagnoses:  Right flank pain     ED Discharge Orders         Ordered    HYDROcodone-acetaminophen (NORCO) 5-325 MG tablet  Every 6 hours PRN        04/03/20 0102    cyclobenzaprine (FLEXERIL) 5 MG tablet  3 times daily PRN        04/03/20 0102          *Please note:  Lyzette Reinhardt was evaluated in Emergency Department on 04/03/2020 for the symptoms described in the history of present illness. She was evaluated in the context of the global COVID-19 pandemic, which necessitated consideration that the patient might be at risk for infection with the SARS-CoV-2 virus that causes COVID-19. Institutional protocols and algorithms that pertain to the evaluation of patients at risk for COVID-19 are in a state of rapid change based on information released by regulatory bodies including the CDC and federal and state organizations. These policies and algorithms were followed during the patient's care in the ED.  Some ED evaluations and interventions may be delayed as a result of limited staffing during and the pandemic.*   Note:  This document was prepared using Dragon voice recognition software and may include unintentional dictation errors.   Ceonna Frazzini, Layla Maw, DO 04/03/20 [phone]

## 2020-04-03 NOTE — ED Notes (Signed)
Pt signed her discharge paper work at this time

## 2020-06-01 ENCOUNTER — Other Ambulatory Visit: Payer: Self-pay | Admitting: Family Medicine

## 2020-06-01 DIAGNOSIS — Z1231 Encounter for screening mammogram for malignant neoplasm of breast: Secondary | ICD-10-CM

## 2022-07-05 ENCOUNTER — Emergency Department
Admission: EM | Admit: 2022-07-05 | Discharge: 2022-07-05 | Disposition: A | Discharge status: Home / Self Care | Payer: BC Managed Care – PPO | Attending: Emergency Medicine | Admitting: Emergency Medicine

## 2022-07-05 ENCOUNTER — Emergency Department: Payer: BC Managed Care – PPO

## 2022-07-05 ENCOUNTER — Other Ambulatory Visit: Payer: Self-pay

## 2022-07-05 DIAGNOSIS — S199XXA Unspecified injury of neck, initial encounter: Secondary | ICD-10-CM | POA: Diagnosis present

## 2022-07-05 DIAGNOSIS — S134XXA Sprain of ligaments of cervical spine, initial encounter: Secondary | ICD-10-CM | POA: Insufficient documentation

## 2022-07-05 DIAGNOSIS — Y92410 Unspecified street and highway as the place of occurrence of the external cause: Secondary | ICD-10-CM | POA: Insufficient documentation

## 2022-07-05 DIAGNOSIS — M436 Torticollis: Secondary | ICD-10-CM

## 2022-07-05 MED ORDER — LIDOCAINE 5 % EX PTCH
1.0000 | MEDICATED_PATCH | CUTANEOUS | Status: DC
  Filled 2022-07-05: qty 1

## 2022-07-05 MED ORDER — METHOCARBAMOL 500 MG PO TABS
500.0000 mg | ORAL_TABLET | Freq: Once | ORAL | Status: AC
  Administered 2022-07-05: 500 mg via ORAL
  Filled 2022-07-05: qty 1

## 2022-07-05 MED ORDER — ACETAMINOPHEN 500 MG PO TABS
1000.0000 mg | ORAL_TABLET | Freq: Once | ORAL | Status: AC
  Administered 2022-07-05: 1000 mg via ORAL
  Filled 2022-07-05: qty 2

## 2022-07-05 MED ORDER — KETOROLAC TROMETHAMINE 30 MG/ML IJ SOLN
30.0000 mg | Freq: Once | INTRAMUSCULAR | Status: DC
  Filled 2022-07-05: qty 1
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
## 2022-08-20 ENCOUNTER — Other Ambulatory Visit: Payer: Self-pay | Admitting: Orthopedic Surgery

## 2022-08-20 DIAGNOSIS — M5412 Radiculopathy, cervical region: Secondary | ICD-10-CM

## 2022-08-21 ENCOUNTER — Ambulatory Visit
Admission: RE | Admit: 2022-08-21 | Discharge: 2022-08-21 | Disposition: A | Discharge status: Home / Self Care | Payer: BC Managed Care – PPO | Source: Ambulatory Visit | Attending: Orthopedic Surgery | Admitting: Orthopedic Surgery

## 2022-08-21 DIAGNOSIS — M5412 Radiculopathy, cervical region: Secondary | ICD-10-CM | POA: Diagnosis present

## 2023-07-01 LAB — EXTERNAL GENERIC LAB PROCEDURE: COLOGUARD: NEGATIVE

## 2023-07-01 LAB — COLOGUARD: COLOGUARD: NEGATIVE

## 2023-08-13 ENCOUNTER — Inpatient Hospital Stay

## 2023-08-13 ENCOUNTER — Inpatient Hospital Stay: Attending: Oncology | Admitting: Oncology

## 2023-08-13 ENCOUNTER — Encounter: Payer: Self-pay | Admitting: Oncology

## 2023-08-13 VITALS — BP 116/73 | HR 87 | Temp 96.8°F | Resp 18 | Wt 166.6 lb

## 2023-08-13 DIAGNOSIS — Z7984 Long term (current) use of oral hypoglycemic drugs: Secondary | ICD-10-CM | POA: Diagnosis not present

## 2023-08-13 DIAGNOSIS — E119 Type 2 diabetes mellitus without complications: Secondary | ICD-10-CM | POA: Insufficient documentation

## 2023-08-13 DIAGNOSIS — F17210 Nicotine dependence, cigarettes, uncomplicated: Secondary | ICD-10-CM | POA: Insufficient documentation

## 2023-08-13 DIAGNOSIS — K589 Irritable bowel syndrome without diarrhea: Secondary | ICD-10-CM | POA: Insufficient documentation

## 2023-08-13 DIAGNOSIS — D75839 Thrombocytosis, unspecified: Secondary | ICD-10-CM

## 2023-08-13 DIAGNOSIS — E611 Iron deficiency: Secondary | ICD-10-CM | POA: Diagnosis not present

## 2023-08-13 LAB — IRON AND TIBC
Iron: 36 ug/dL (ref 28–170)
Saturation Ratios: 9 % — ABNORMAL LOW (ref 10.4–31.8)
TIBC: 417 ug/dL (ref 250–450)
UIBC: 381 ug/dL

## 2023-08-13 LAB — CBC WITH DIFFERENTIAL/PLATELET
Abs Immature Granulocytes: 0.05 10*3/uL (ref 0.00–0.07)
Basophils Absolute: 0.1 10*3/uL (ref 0.0–0.1)
Basophils Relative: 1 %
Eosinophils Absolute: 0.6 10*3/uL — ABNORMAL HIGH (ref 0.0–0.5)
Eosinophils Relative: 7 %
HCT: 39.7 % (ref 36.0–46.0)
Hemoglobin: 12.9 g/dL (ref 12.0–15.0)
Immature Granulocytes: 1 %
Lymphocytes Relative: 30 %
Lymphs Abs: 2.5 10*3/uL (ref 0.7–4.0)
MCH: 26.4 pg (ref 26.0–34.0)
MCHC: 32.5 g/dL (ref 30.0–36.0)
MCV: 81.4 fL (ref 80.0–100.0)
Monocytes Absolute: 0.5 10*3/uL (ref 0.1–1.0)
Monocytes Relative: 5 %
Neutro Abs: 4.7 10*3/uL (ref 1.7–7.7)
Neutrophils Relative %: 56 %
Platelets: 464 10*3/uL — ABNORMAL HIGH (ref 150–400)
RBC: 4.88 MIL/uL (ref 3.87–5.11)
RDW: 15.4 % (ref 11.5–15.5)
WBC: 8.4 10*3/uL (ref 4.0–10.5)
nRBC: 0 % (ref 0.0–0.2)

## 2023-08-13 LAB — RETIC PANEL
Immature Retic Fract: 23.8 % — ABNORMAL HIGH (ref 2.3–15.9)
RBC.: 4.88 MIL/uL (ref 3.87–5.11)
Retic Count, Absolute: 85.4 10*3/uL (ref 19.0–186.0)
Retic Ct Pct: 1.8 % (ref 0.4–3.1)
Reticulocyte Hemoglobin: 28 pg (ref >27.9)

## 2023-08-13 LAB — HIV ANTIBODY (ROUTINE TESTING W REFLEX): HIV Screen 4th Generation wRfx: NONREACTIVE

## 2023-08-13 LAB — FERRITIN: Ferritin: 32 ng/mL (ref 11–307)

## 2023-08-13 NOTE — Assessment & Plan Note (Signed)
 I discussed with patient that the differential diagnosis of the thrombosis is broad, including benign etiology such as reactive to surgery, trauma,  infection, nutrition deficiency, smoking etc, as well as malignant etiology including underlying bone marrow disorders.   For the work up of patient's thrombocytosis, I recommend checking CBC;  smear review, JAK 2 mutation,  BCR ABL; MPL; CALR mutation, iron tibc ferritin, retic panel.

## 2023-08-13 NOTE — Progress Notes (Addendum)
 Hematology/Oncology Consult note [phone] [phone]        REFERRING PROVIDER: Buster Cash*   CHIEF COMPLAINTS/REASON FOR VISIT:  Evaluation of thrombocytosis   ASSESSMENT & PLAN:   Thrombocytosis I discussed with patient that the differential diagnosis of the thrombosis is broad, including benign etiology such as reactive to surgery, trauma,  infection, nutrition deficiency, smoking etc, as well as malignant etiology including underlying bone marrow disorders.   For the work up of patient's thrombocytosis, I recommend checking CBC;  smear review, JAK 2 mutation,  BCR ABL; MPL; CALR mutation, iron tibc ferritin, retic panel.   Iron deficiency Lab Results  Component Value Date   HGB 12.9 08/13/2023   TIBC 417 08/13/2023   IRONPCTSAT 9 (L) 08/13/2023   FERRITIN 32 08/13/2023     She has no anemia, mild decrease of iron saturation.  She has previously received Feraheme and tolerated well. She is interested in receiving IV iron infusion.  Kenny Peals is not covered by her insurance.  I discussed  IV Venofer treatments. I discussed about the potential risks including but not limited to allergic reactions/infusion reactions including anaphylactic reactions, diarrhea, phlebitis, high blood pressure, wheezing, SOB, skin rash, weight gain,dark urine, leg swelling, back pain, headache, nausea and fatigue, etc. Patient agrees with the plan.  Recommend IV Venofer weekly x 2.     Orders Placed This Encounter  Procedures   Ferritin    Standing Status:   Future    Number of Occurrences:   1    Expected Date:   08/13/2023    Expiration Date:   11/11/2023   Iron and TIBC    Standing Status:   Future    Number of Occurrences:   1    Expected Date:   08/13/2023    Expiration Date:   11/11/2023   CBC with Differential/Platelet    Standing Status:   Future    Number of Occurrences:   1    Expected Date:   08/13/2023    Expiration Date:   11/11/2023    Retic Panel    Standing Status:   Future    Number of Occurrences:   1    Expected Date:   08/13/2023    Expiration Date:   11/11/2023   JAK2 V617F rfx CALR/MPL/E12-15    Standing Status:   Future    Number of Occurrences:   1    Expected Date:   08/13/2023    Expiration Date:   11/11/2023   BCR-ABL1 FISH    Standing Status:   Future    Number of Occurrences:   1    Expected Date:   08/13/2023    Expiration Date:   11/11/2023   HIV Antibody (routine testing w rflx)    Standing Status:   Future    Number of Occurrences:   1    Expected Date:   08/13/2023    Expiration Date:   11/11/2023   Follow up TBD All questions were answered. The patient knows to call the clinic with any problems, questions or concerns.  [name], MD, PhD [facility] Hematology Oncology 08/13/2023   HISTORY OF PRESENTING ILLNESS:   Carrie Graham is a  53 y.o.  female with PMH listed below was seen in consultation at the request of  Buster Cash*  for evaluation of thrombocytosis.   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  Her history of iron deficiency, previously managed with Feraheme infusions, may contribute to this issue. After a hysterectomy eight years ago, her blood levels stabilized without further treatment until now.  She smokes less than half a pack of cigarettes per day, which could potentially contribute to her elevated platelet counts. She has a positive ANA, which increased from 1:80 in 2022 to 1:160,  She experiences joint pain in her hips and thighs, described as feeling like her 'thigh bones pulsating.'  Her family history includes a grandmother who required frequent blood transfusions and had a heart attack during one, and a daughter who has recently started receiving blood treatments. She has a history of hidradenitis suppurativa, managed with antibiotics.     MEDICAL HISTORY:  Past Medical History:  Diagnosis  Date   Anemia    Bleeding disorder (HCC)    Diabetes mellitus without complication (HCC) 2014   GERD (gastroesophageal reflux disease)    IBS (irritable bowel syndrome)    Insomnia    Kidney stones    Ulcer     SURGICAL HISTORY: Past Surgical History:  Procedure Laterality Date   ABDOMINAL HYSTERECTOMY  2014   ABDOMINOPLASTY  2000   CESAREAN SECTION  1610,9604   CHOLECYSTECTOMY  1994   HERNIA REPAIR  03-03-13   umbilical hernia   LEFT OOPHORECTOMY  2014   SHOULDER SURGERY      SOCIAL HISTORY: Social History   Socioeconomic History   Marital status: Single    Spouse name: Not on file   Number of children: Not on file   Years of education: Not on file   Highest education level: Not on file  Occupational History   Not on file  Tobacco Use   Smoking status: Every Day    Current packs/day: 0.50    Types: Cigarettes   Smokeless tobacco: Never  Substance and Sexual Activity   Alcohol use: No   Drug use: No   Sexual activity: Not on file  Other Topics Concern   Not on file  Social History Narrative   Not on file   Social Drivers of Health   Financial Resource Strain: Low Risk  (08/13/2023)   Overall Financial Resource Strain (CARDIA)    Difficulty of Paying Living Expenses: Not very hard  Food Insecurity: No Food Insecurity (08/13/2023)   Hunger Vital Sign    Worried About Running Out of Food in the Last Year: Never true    Ran Out of Food in the Last Year: Never true  Transportation Needs: No Transportation Needs (06/18/2023)   Received from [facility]   PRAPARE - Transportation    Lack of Transportation (Medical): No    Lack of Transportation (Non-Medical): No  Physical Activity: Not on file  Stress: No Stress Concern Present (08/13/2023)   [employer] of Occupational Health - Occupational Stress Questionnaire    Feeling of Stress: Only a little  Social Connections: Not on file  Intimate Partner Violence: Not At Risk (08/13/2023)   Humiliation,  Afraid, Rape, and Kick questionnaire    Fear of Current or Ex-Partner: No    Emotionally Abused: No    Physically Abused: No    Sexually Abused: No    FAMILY HISTORY: Family History  Problem Relation Age of Onset   Cancer Mother        ovarian    ALLERGIES:  is allergic to nsaids, oxycodone , sulfa antibiotics, and toradol  [ketorolac  tromethamine ].  MEDICATIONS:  Current Outpatient Medications  Medication Sig Dispense Refill  ALPRAZolam (XANAX) 0.5 MG tablet Take 0.5 mg by mouth as needed for anxiety.     amoxicillin -clavulanate (AUGMENTIN ) 875-125 MG tablet Take 1 tablet by mouth 2 (two) times daily. 14 tablet 0   brompheniramine-pseudoephedrine-DM 30-2-10 MG/5ML syrup Take 10 mLs by mouth 4 (four) times daily as needed. 200 mL 0   cyclobenzaprine  (FLEXERIL ) 5 MG tablet Take 1 tablet (5 mg total) by mouth 3 (three) times daily as needed for muscle spasms. 15 tablet 0   dicyclomine  (BENTYL ) 10 MG capsule Take 1 capsule (10 mg total) by mouth 4 (four) times daily as needed for up to 5 days for spasms. 15 capsule 0   doxycycline  (VIBRA -TABS) 100 MG tablet Take 1 tablet (100 mg total) by mouth 2 (two) times daily. 20 tablet 0   escitalopram (LEXAPRO) 10 MG tablet Take 10 mg by mouth daily.     fluticasone  (FLONASE ) 50 MCG/ACT nasal spray Place 1 spray into both nostrils 2 (two) times daily. 16 g 0   HYDROcodone -acetaminophen  (NORCO) 5-325 MG tablet Take 2 tablets by mouth every 6 (six) hours as needed for moderate pain. 14 tablet 0   Insulin  Aspart (NOVOLOG  [place]) Inject into the skin 3 (three) times daily.     insulin  aspart (NOVOLOG ) 100 UNIT/ML injection Per sliding scale 10 mL 3   Insulin  Detemir (LEVEMIR  [place]) Inject into the skin 2 (two) times daily.     insulin  detemir (LEVEMIR ) 100 UNIT/ML injection Inject 0.14 mLs (14 Units total) into the skin 2 (two) times daily. 10 mL 3   loratadine  (CLARITIN ) 10 MG tablet Take 1 tablet (10 mg total) by mouth daily. 30 tablet 2   ondansetron   (ZOFRAN  ODT) 8 MG disintegrating tablet Take 1 tablet (8 mg total) by mouth every 8 (eight) hours as needed for nausea or vomiting. 15 tablet 0   oxyCODONE -acetaminophen  (ROXICET) 5-325 MG tablet Take 1 tablet by mouth every 4 (four) hours as needed for severe pain. 20 tablet 0   sitaGLIPtin (JANUVIA) 100 MG tablet Take 100 mg by mouth daily.     temazepam (RESTORIL) 15 MG capsule Take 15 mg by mouth at bedtime as needed for sleep.     trimethoprim -polymyxin b  (POLYTRIM ) ophthalmic solution Place 2 drops into the left eye every 6 (six) hours. 10 mL 0   No current facility-administered medications for this visit.    Review of Systems  Constitutional:  Negative for appetite change, chills, fatigue and fever.  HENT:   Negative for hearing loss and voice change.   Eyes:  Negative for eye problems.  Respiratory:  Negative for chest tightness and cough.   Cardiovascular:  Negative for chest pain.  Gastrointestinal:  Negative for abdominal distention, abdominal pain and blood in stool.  Endocrine: Negative for hot flashes.  Genitourinary:  Negative for difficulty urinating and frequency.   Musculoskeletal:  Negative for arthralgias.  Skin:  Negative for itching and rash.  Neurological:  Negative for extremity weakness.  Hematological:  Negative for adenopathy.  Psychiatric/Behavioral:  Negative for confusion.    PHYSICAL EXAMINATION:  Vitals:   08/13/23 1118  BP: 116/73  Pulse: 87  Resp: 18  Temp: (!) 96.8 F (36 C)  SpO2: 97%   Filed Weights   08/13/23 1118  Weight: 166 lb 9.6 oz (75.6 kg)    Physical Exam Constitutional:      General: She is not in acute distress. HENT:     Head: Normocephalic and atraumatic.   Eyes:     General:  No scleral icterus.   Cardiovascular:     Rate and Rhythm: Normal rate and regular rhythm.     Heart sounds: Normal heart sounds.  Pulmonary:     Effort: Pulmonary effort is normal. No respiratory distress.     Breath sounds: Normal breath  sounds. No wheezing.  Abdominal:     General: Bowel sounds are normal. There is no distension.     Palpations: Abdomen is soft.   Musculoskeletal:        General: No deformity. Normal range of motion.     Cervical back: Normal range of motion and neck supple.   Skin:    General: Skin is warm and dry.     Findings: No erythema or rash.   Neurological:     Mental Status: She is alert and oriented to person, place, and time. Mental status is at baseline.     Cranial Nerves: No cranial nerve deficit.   Psychiatric:        Mood and Affect: Mood normal.     LABORATORY DATA:  I have reviewed the data as listed    Latest Ref Rng & Units 08/13/2023   12:00 PM 04/02/2020   10:00 PM 09/04/2018   12:29 AM  CBC  WBC 4.0 - 10.5 K/uL 8.4  12.4  9.4   Hemoglobin 12.0 - 15.0 g/dL 11.9  14.7  82.9   Hematocrit 36.0 - 46.0 % 39.7  37.6  40.9   Platelets 150 - 400 K/uL 464  413  402       Latest Ref Rng & Units 04/02/2020   10:00 PM 09/04/2018   12:29 AM 11/13/2017    8:34 PM  CMP  Glucose 70 - 99 mg/dL 562  130  865   BUN 6 - 20 mg/dL 14  12  10    Creatinine 0.44 - 1.00 mg/dL 7.84  6.96  2.95   Sodium 135 - 145 mmol/L 138  134  135   Potassium 3.5 - 5.1 mmol/L 3.5  3.7  3.7   Chloride 98 - 111 mmol/L 101  99  98   CO2 22 - 32 mmol/L 29  24  28    Calcium 8.9 - 10.3 mg/dL 9.1  9.5  28.4   Total Protein 6.5 - 8.1 g/dL 7.6   8.3   Total Bilirubin 0.3 - 1.2 mg/dL 0.3   0.4   Alkaline Phos 38 - 126 U/L 102   133   AST 15 - 41 U/L 19   29   ALT 0 - 44 U/L 23   41       RADIOGRAPHIC STUDIES: I have personally reviewed the radiological images as listed and agreed with the findings in the report. No results found.

## 2023-08-13 NOTE — Assessment & Plan Note (Addendum)
 Lab Results  Component Value Date   HGB 12.9 08/13/2023   TIBC 417 08/13/2023   IRONPCTSAT 9 (L) 08/13/2023   FERRITIN 32 08/13/2023     She has no anemia, mild decrease of iron saturation.  She has previously received Feraheme and tolerated well. She is interested in receiving IV iron infusion.  Kenny Peals is not covered by her insurance.  I discussed  IV Venofer treatments. I discussed about the potential risks including but not limited to allergic reactions/infusion reactions including anaphylactic reactions, diarrhea, phlebitis, high blood pressure, wheezing, SOB, skin rash, weight gain,dark urine, leg swelling, back pain, headache, nausea and fatigue, etc. Patient agrees with the plan.  Recommend IV Venofer weekly x 2.

## 2023-08-14 ENCOUNTER — Encounter: Payer: Self-pay | Admitting: Oncology

## 2023-08-14 NOTE — Addendum Note (Signed)
 Addended by: [name] on: 08/14/2023 09:32 AM   Modules accepted: Orders

## 2023-08-16 LAB — BCR-ABL1 FISH
Cells Analyzed: 200
Cells Counted: 200

## 2023-08-19 LAB — CALR +MPL + E12-E15  (REFLEX)

## 2023-08-19 LAB — JAK2 V617F RFX CALR/MPL/E12-15

## 2023-08-21 ENCOUNTER — Other Ambulatory Visit: Payer: Self-pay | Admitting: Oncology

## 2023-08-21 ENCOUNTER — Ambulatory Visit: Payer: Self-pay | Admitting: Oncology

## 2023-08-22 ENCOUNTER — Other Ambulatory Visit: Payer: Self-pay

## 2023-08-22 DIAGNOSIS — D75839 Thrombocytosis, unspecified: Secondary | ICD-10-CM

## 2023-08-28 ENCOUNTER — Inpatient Hospital Stay: Attending: Oncology

## 2023-08-28 VITALS — BP 119/61 | HR 90 | Temp 98.0°F | Resp 16

## 2023-08-28 DIAGNOSIS — E611 Iron deficiency: Secondary | ICD-10-CM | POA: Diagnosis present

## 2023-08-28 MED ORDER — IRON SUCROSE 20 MG/ML IV SOLN
200.0000 mg | Freq: Once | INTRAVENOUS | Status: AC
  Administered 2023-08-28: 200 mg via INTRAVENOUS
  Filled 2023-08-28: qty 10

## 2023-08-28 NOTE — Patient Instructions (Signed)

## 2023-09-04 ENCOUNTER — Inpatient Hospital Stay

## 2023-09-04 VITALS — BP 124/67 | HR 77 | Temp 96.9°F | Resp 18

## 2023-09-04 DIAGNOSIS — E611 Iron deficiency: Secondary | ICD-10-CM

## 2023-09-04 MED ORDER — IRON SUCROSE 20 MG/ML IV SOLN
200.0000 mg | Freq: Once | INTRAVENOUS | Status: AC
  Administered 2023-09-04: 200 mg via INTRAVENOUS
  Filled 2023-09-04: qty 10

## 2023-10-30 ENCOUNTER — Inpatient Hospital Stay

## 2023-11-05 ENCOUNTER — Ambulatory Visit

## 2023-11-05 ENCOUNTER — Ambulatory Visit: Admitting: Oncology

## 2023-11-20 ENCOUNTER — Encounter: Payer: Self-pay | Admitting: Oncology

## 2023-11-27 ENCOUNTER — Inpatient Hospital Stay: Payer: Self-pay

## 2023-12-02 ENCOUNTER — Telehealth: Payer: Self-pay | Admitting: Oncology

## 2023-12-02 NOTE — Telephone Encounter (Signed)
 Pt is scheduled for MD/iron  on 10/8.  Looking at cancel reason on the lab for 10/1. Pt canceled due to insurance cards not being in yet.   I called and left vm for pt to call us  back to let us  know if we can r/s the labs prior to MD visit or r/s all appts or just cancel appts for now.

## 2023-12-04 ENCOUNTER — Inpatient Hospital Stay: Payer: Self-pay | Admitting: Oncology

## 2023-12-04 ENCOUNTER — Inpatient Hospital Stay: Payer: Self-pay
